# Patient Record
Sex: Female | Born: 2000 | Race: Black or African American | Hispanic: No | Marital: Single | Smoking: Never smoker
Health system: Southern US, Community
[De-identification: ages and names within clinical notes are randomized; demographics above are authoritative.]

## PROBLEM LIST (undated history)

## (undated) ENCOUNTER — Inpatient Hospital Stay (HOSPITAL_COMMUNITY): Payer: Self-pay

## (undated) DIAGNOSIS — R1110 Vomiting, unspecified: Secondary | ICD-10-CM

## (undated) DIAGNOSIS — G4700 Insomnia, unspecified: Secondary | ICD-10-CM

## (undated) DIAGNOSIS — O1414 Severe pre-eclampsia complicating childbirth: Secondary | ICD-10-CM

## (undated) DIAGNOSIS — F308 Other manic episodes: Secondary | ICD-10-CM

## (undated) DIAGNOSIS — O98819 Other maternal infectious and parasitic diseases complicating pregnancy, unspecified trimester: Secondary | ICD-10-CM

## (undated) DIAGNOSIS — K219 Gastro-esophageal reflux disease without esophagitis: Secondary | ICD-10-CM

## (undated) DIAGNOSIS — B2790 Infectious mononucleosis, unspecified without complication: Secondary | ICD-10-CM

## (undated) DIAGNOSIS — F329 Major depressive disorder, single episode, unspecified: Secondary | ICD-10-CM

## (undated) DIAGNOSIS — K589 Irritable bowel syndrome without diarrhea: Secondary | ICD-10-CM

## (undated) DIAGNOSIS — O139 Gestational [pregnancy-induced] hypertension without significant proteinuria, unspecified trimester: Secondary | ICD-10-CM

## (undated) DIAGNOSIS — A749 Chlamydial infection, unspecified: Secondary | ICD-10-CM

## (undated) DIAGNOSIS — N390 Urinary tract infection, site not specified: Secondary | ICD-10-CM

## (undated) DIAGNOSIS — F509 Eating disorder, unspecified: Secondary | ICD-10-CM

## (undated) DIAGNOSIS — F319 Bipolar disorder, unspecified: Secondary | ICD-10-CM

## (undated) DIAGNOSIS — R4586 Emotional lability: Secondary | ICD-10-CM

## (undated) DIAGNOSIS — G2111 Neuroleptic induced parkinsonism: Secondary | ICD-10-CM

## (undated) DIAGNOSIS — O039 Complete or unspecified spontaneous abortion without complication: Secondary | ICD-10-CM

## (undated) DIAGNOSIS — F419 Anxiety disorder, unspecified: Secondary | ICD-10-CM

## (undated) DIAGNOSIS — G43909 Migraine, unspecified, not intractable, without status migrainosus: Secondary | ICD-10-CM

## (undated) DIAGNOSIS — F32A Depression, unspecified: Secondary | ICD-10-CM

## (undated) DIAGNOSIS — N200 Calculus of kidney: Secondary | ICD-10-CM

## (undated) HISTORY — DX: Severe pre-eclampsia complicating childbirth: O14.14

## (undated) HISTORY — PX: COLONOSCOPY: SHX174

## (undated) HISTORY — DX: Vomiting, unspecified: R11.10

## (undated) HISTORY — DX: Chlamydial infection, unspecified: A74.9

## (undated) HISTORY — DX: Gastro-esophageal reflux disease without esophagitis: K21.9

## (undated) HISTORY — DX: Other maternal infectious and parasitic diseases complicating pregnancy, unspecified trimester: O98.819

## (undated) HISTORY — PX: ESOPHAGOGASTRODUODENOSCOPY ENDOSCOPY: SHX5814

## (undated) HISTORY — DX: Neuroleptic induced Parkinsonism: G21.11

---
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
## 2019-01-31 ENCOUNTER — Emergency Department (HOSPITAL_COMMUNITY)
Admission: EM | Admit: 2019-01-31 | Discharge: 2019-01-31 | Disposition: A | Discharge status: Home / Self Care | Payer: Medicaid Other | Attending: Emergency Medicine | Admitting: Emergency Medicine

## 2019-01-31 ENCOUNTER — Encounter (HOSPITAL_COMMUNITY): Payer: Self-pay | Admitting: Emergency Medicine

## 2019-01-31 ENCOUNTER — Other Ambulatory Visit: Payer: Self-pay

## 2019-01-31 DIAGNOSIS — R3915 Urgency of urination: Secondary | ICD-10-CM | POA: Insufficient documentation

## 2019-01-31 DIAGNOSIS — Z7722 Contact with and (suspected) exposure to environmental tobacco smoke (acute) (chronic): Secondary | ICD-10-CM | POA: Diagnosis not present

## 2019-01-31 DIAGNOSIS — N3000 Acute cystitis without hematuria: Secondary | ICD-10-CM | POA: Diagnosis not present

## 2019-01-31 DIAGNOSIS — R1030 Lower abdominal pain, unspecified: Secondary | ICD-10-CM

## 2019-01-31 DIAGNOSIS — R110 Nausea: Secondary | ICD-10-CM | POA: Insufficient documentation

## 2019-01-31 DIAGNOSIS — R350 Frequency of micturition: Secondary | ICD-10-CM | POA: Insufficient documentation

## 2019-01-31 LAB — COMPREHENSIVE METABOLIC PANEL
ALT: 13 U/L (ref 0–44)
AST: 15 U/L (ref 15–41)
Albumin: 3.9 g/dL (ref 3.5–5.0)
Alkaline Phosphatase: 85 U/L (ref 38–126)
Anion gap: 10 (ref 5–15)
BUN: 11 mg/dL (ref 6–20)
CO2: 24 mmol/L (ref 22–32)
Calcium: 9.4 mg/dL (ref 8.9–10.3)
Chloride: 103 mmol/L (ref 98–111)
Creatinine, Ser: 0.68 mg/dL (ref 0.44–1.00)
GFR calc Af Amer: >60 mL/min (ref >60)
GFR calc non Af Amer: >60 mL/min (ref >60)
Glucose, Bld: 97 mg/dL (ref 70–99)
Potassium: 4.4 mmol/L (ref 3.5–5.1)
Sodium: 137 mmol/L (ref 135–145)
Total Bilirubin: 0.5 mg/dL (ref 0.3–1.2)
Total Protein: 7.1 g/dL (ref 6.5–8.1)

## 2019-01-31 LAB — CBC
HCT: 41.9 % (ref 36.0–46.0)
Hemoglobin: 14.3 g/dL (ref 12.0–15.0)
MCH: 30.2 pg (ref 26.0–34.0)
MCHC: 34.1 g/dL (ref 30.0–36.0)
MCV: 88.6 fL (ref 80.0–100.0)
Platelets: 377 10*3/uL (ref 150–400)
RBC: 4.73 MIL/uL (ref 3.87–5.11)
RDW: 12.9 % (ref 11.5–15.5)
WBC: 8.6 10*3/uL (ref 4.0–10.5)
nRBC: 0 % (ref 0.0–0.2)

## 2019-01-31 LAB — URINALYSIS, ROUTINE W REFLEX MICROSCOPIC
Bilirubin Urine: NEGATIVE
Glucose, UA: NEGATIVE mg/dL
Ketones, ur: NEGATIVE mg/dL
Leukocytes,Ua: NEGATIVE
Nitrite: POSITIVE — AB
Protein, ur: NEGATIVE mg/dL
Specific Gravity, Urine: 1.019 (ref 1.005–1.030)
pH: 6 (ref 5.0–8.0)

## 2019-01-31 LAB — I-STAT BETA HCG BLOOD, ED (MC, WL, AP ONLY): I-stat hCG, quantitative: <5 m[IU]/mL (ref <5)

## 2019-01-31 LAB — LIPASE, BLOOD: Lipase: 23 U/L (ref 11–51)

## 2019-01-31 MED ORDER — SODIUM CHLORIDE 0.9% FLUSH: 3.0000 mL | Freq: Once | INTRAVENOUS | Status: DC

## 2019-01-31 MED ORDER — ACETAMINOPHEN 500 MG PO TABS
1000.0000 mg | ORAL_TABLET | Freq: Once | ORAL | Status: AC
  Administered 2019-01-31: 1000 mg via ORAL
  Filled 2019-01-31: qty 2

## 2019-01-31 MED ORDER — CEPHALEXIN 250 MG PO CAPS
500.0000 mg | ORAL_CAPSULE | Freq: Once | ORAL | Status: AC
  Administered 2019-01-31: 08:00:00 500 mg via ORAL
  Filled 2019-01-31: qty 2

## 2019-01-31 MED ORDER — CEPHALEXIN 500 MG PO CAPS: 500.0000 mg | ORAL_CAPSULE | Freq: Four times a day (QID) | ORAL | 0 refills | Status: DC

## 2019-01-31 MED ORDER — PHENAZOPYRIDINE HCL 200 MG PO TABS: 200.0000 mg | ORAL_TABLET | Freq: Three times a day (TID) | ORAL | 0 refills | Status: DC

## 2019-01-31 MED ORDER — NAPROXEN 500 MG PO TABS: 500.0000 mg | ORAL_TABLET | Freq: Two times a day (BID) | ORAL | 0 refills | Status: DC

## 2019-01-31 NOTE — Discharge Instructions (Signed)
Your abdominal pain is from a urinary tract infection.  Take cephalexin (Keflex) as prescribed 4 times daily.  For bladder pain take Pyridium, this medicine may make your urine really bright orange, this is normal. For pain and inflammation you can use a combination of acetaminophen and naproxen.  Take [phone] mg acetaminophen (tylenol) every 6 hours and/or 500 mg naproxen every 12 hours You can take these separately or combine them for maximum pain control. Do not exceed 4,000 mg acetaminophen or 1,000 mg naproxen in a 24 hour period.  Do not take naproxen/ibuprofen containing products if you are pregnant or have history of kidney disease, ulcers, GI bleeding, severe acid reflux, or take a blood thinner.  Do not take acetaminophen if you have liver disease.

## 2019-01-31 NOTE — ED Triage Notes (Signed)
Patient here with abdominal pain for the last three days.  Patient has had some nausea, but not at this time.  Patient states that it is generalized abdominal pain.  She states she could be pregnant.

## 2019-01-31 NOTE — ED Provider Notes (Signed)
[facility]ENC[facility] Provider Note   CSN: [id number] Arrival date & time: 01/31/19  [id number]    History   Chief Complaint Chief Complaint  Patient presents with  . Abdominal Pain    HPI Jamie Lawrence is a 18 y.o. female presents to the ER for evaluation of lower abdominal pain.  Onset 3 days ago.  Persistent, worsening, constant described as sharp and stabbing.  Localized to the lower abdomen.  Has had associated intermittent, milder bilateral side pain that switches between the right and the left, nausea, urinary frequency and urinary urgency.  A few days ago had vaginal blood spotting but not a full period.  States she has not had a period in about 2 months and could be pregnant.  No associated fever, vomiting, diarrhea, constipation, blood in stool, dysuria, abnormal vaginal discharge.  She is not concerned about STDs today.  No interventions.  Pain is worse with movement and palpation.  No alleviating factors.      HPI  Past Medical History:  Diagnosis Date  . Anxiety   . Bipolar depression (HCC)   . Chlamydia infection affecting pregnancy 07/06/2017   tx on 07/04/17  . Depression   . Eating disorder   . GERD (gastroesophageal reflux disease)   . Hypertension in pregnancy, preeclampsia, severe, delivered 07/05/2017  . Hypomania (HCC)   . IBS (irritable bowel syndrome)   . Insomnia   . Migraines   . Miscarriage March 2016  . Mononucleosis   . Mood disturbance   . 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 Vomiting     Patient Active Problem List   Diagnosis Date Noted  . Chlamydia infection 08/23/2017  . Depression   . Panic attacks 07/07/2017  . 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 GAD (generalized anxiety disorder) 12/08/2014  . Bulimia nervosa 12/08/2014    Past Surgical History:  Procedure Laterality Date  . COLONOSCOPY    . ESOPHAGOGASTRODUODENOSCOPY ENDOSCOPY        OB History    Gravida  2   Para  1   Term  0   Preterm  1   AB  1   Living  1     SAB  1   TAB  0   Ectopic  0   Multiple  0   Live Births  1            Home Medications    Prior to Admission medications   Medication Sig Start Date End Date Taking? Authorizing Provider  cephALEXin (KEFLEX) 500 MG capsule Take 1 capsule (500 mg total) by mouth 4 (four) times daily. 01/31/19   Liberty HandyGibbons, Edwardo Wojnarowski J, PA-C  lidocaine (LIDODERM) 5 % Place 1 patch onto the skin every 12 (twelve) hours. Remove & Discard patch within 12 hours or as directed by MD 11/09/18 11/09/19  Elson AreasSofia, Leslie K, PA-C  methocarbamol (ROBAXIN) 500 MG tablet Take 2 tablets (1,000 mg total) by mouth every 8 (eight) hours as needed for muscle spasms. 12/08/18   Loren RacerYelverton, David, MD  naproxen (NAPROSYN) 500 MG tablet Take 1 tablet (500 mg total) by mouth 2 (two) times daily. 01/31/19   Liberty HandyGibbons, Tannya Gonet J, PA-C  phenazopyridine (PYRIDIUM) 200 MG tablet Take 1 tablet (200 mg total) by mouth 3 (three) times daily. 01/31/19   Liberty HandyGibbons, Hasna Stefanik J, PA-C    Family History Family History  Problem Relation Age of Onset  . Hypertension Mother   .  Hypertension Father   . Mental illness Father   . Hypertension Maternal Grandmother   . Hypertension Paternal Grandmother   . Hypertension Paternal Grandfather   . Diabetes Paternal Grandfather   . Brain cancer Maternal Aunt   . Colon cancer Maternal Aunt     Social History Social History   Tobacco Use  . Smoking status: Passive Smoke Exposure - Never Smoker  . Smokeless tobacco: Never Used  Substance Use Topics  . Alcohol use: No    Alcohol/week: 0.0 standard drinks  . Drug use: No     Allergies   Patient has no known allergies.   Review of Systems Review of Systems  Gastrointestinal: Positive for abdominal pain.  Genitourinary: Positive for difficulty urinating, frequency and urgency.  All other systems reviewed and are negative.    Physical Exam  Updated Vital Signs BP (!) 141/84 (BP Location: Right Arm)   Pulse (!) 101   Temp 98.7 F (37.1 C) (Oral)   Resp 18   SpO2 100%   Physical Exam Vitals signs and nursing note reviewed.  Constitutional:      Appearance: She is well-developed.     Comments: Non toxic in NAD  HENT:     Head: Normocephalic and atraumatic.     Nose: Nose normal.  Eyes:     Conjunctiva/sclera: Conjunctivae normal.  Neck:     Musculoskeletal: Normal range of motion.  Cardiovascular:     Rate and Rhythm: Normal rate and regular rhythm.  Pulmonary:     Effort: Pulmonary effort is normal.     Breath sounds: Normal breath sounds.  Abdominal:     General: Bowel sounds are normal.     Palpations: Abdomen is soft.     Tenderness: There is abdominal tenderness in the suprapubic area.     Comments: Mild diffuse lower abdominal tenderness worse at suprapubic area.  No CVA tenderness.  No guarding, rigidity, rebound.  Negative McBurney's and Murphy's.  Active BS to lower quadrants.   Musculoskeletal: Normal range of motion.  Skin:    General: Skin is warm and dry.     Capillary Refill: Capillary refill takes less than 2 seconds.  Neurological:     Mental Status: She is alert.  Psychiatric:        Behavior: Behavior normal.      ED Treatments / Results  Labs (all labs ordered are listed, but only abnormal results are displayed) Labs Reviewed  URINALYSIS, ROUTINE W REFLEX MICROSCOPIC - Abnormal; Notable for the following components:      Result Value   APPearance HAZY (*)    Hgb urine dipstick SMALL (*)    Nitrite POSITIVE (*)    Bacteria, UA RARE (*)    All other components within normal limits  URINE CULTURE  LIPASE, BLOOD  COMPREHENSIVE METABOLIC PANEL  CBC  I-STAT BETA HCG BLOOD, ED ([facility], [facility], [facility] ONLY)    EKG None  Radiology No results found.  Procedures Procedures (including critical care time)  Medications Ordered in ED Medications  sodium chloride flush (NS) 0.9 % injection 3  mL (has no administration in time range)  cephALEXin (KEFLEX) capsule 500 mg (has no administration in time range)  acetaminophen (TYLENOL) tablet 1,000 mg (has no administration in time range)     Initial Impression / Assessment and Plan / ED Course  I have reviewed the triage vital signs and the nursing notes.  Pertinent labs & imaging results that were available during my care of the  patient were reviewed by me and considered in my medical decision making (see chart for details).  Clinical Course as of Jan 30 750  Fri Jan 31, 2019  6168 Hgb urine dipstick(!): SMALL [CG]  0736 Nitrite(!): POSITIVE [CG]  0736 WBC, UA: 6-10 [CG]  0736 Bacteria, UA(!): RARE [CG]    Clinical Course User Index [CG] Kinnie Feil, PA-C   I reviewed patient's EMR to obtain pertinent past medical history.  DDX includes UTI.  She has urinary symptoms, suprapubic abdominal pain.  No fever, flank pain, changes in BMs, abnormal vaginal bleeding or discharge.  She is not concerned for STD today.  ER work-up reviewed and interpreted by me.  Positive nitrite, 6-10 WBC and urine is a good sample.  No leukocytosis.  Vital signs are WNL.  Negative pregnancy test.  No signs of pyelonephritis on exam.  Will discharge with Keflex, Pyridium and anti-inflammatories.  Discussed plan with patient who is comfortable with this.  Return precautions discussed.  Final Clinical Impressions(s) / ED Diagnoses   Final diagnoses:  Acute cystitis without hematuria  Lower abdominal pain    ED Discharge Orders         Ordered    cephALEXin (KEFLEX) 500 MG capsule  4 times daily     01/31/19 0743    phenazopyridine (PYRIDIUM) 200 MG tablet  3 times daily     01/31/19 0743    naproxen (NAPROSYN) 500 MG tablet  2 times daily     01/31/19 0743           Kinnie Feil, PA-C 01/31/19 3729    Isla Pence, MD 01/31/19 [phone]

## 2019-02-05 ENCOUNTER — Other Ambulatory Visit: Payer: Self-pay

## 2019-02-05 ENCOUNTER — Encounter (HOSPITAL_COMMUNITY): Payer: Self-pay

## 2019-02-05 ENCOUNTER — Emergency Department (HOSPITAL_COMMUNITY): Payer: Medicaid Other

## 2019-02-05 ENCOUNTER — Emergency Department (HOSPITAL_COMMUNITY)
Admission: EM | Admit: 2019-02-05 | Discharge: 2019-02-05 | Disposition: A | Discharge status: Home / Self Care | Payer: Medicaid Other | Attending: Emergency Medicine | Admitting: Emergency Medicine

## 2019-02-05 DIAGNOSIS — Y998 Other external cause status: Secondary | ICD-10-CM | POA: Insufficient documentation

## 2019-02-05 DIAGNOSIS — S99912A Unspecified injury of left ankle, initial encounter: Secondary | ICD-10-CM

## 2019-02-05 DIAGNOSIS — Y9301 Activity, walking, marching and hiking: Secondary | ICD-10-CM | POA: Insufficient documentation

## 2019-02-05 DIAGNOSIS — X509XXA Other and unspecified overexertion or strenuous movements or postures, initial encounter: Secondary | ICD-10-CM | POA: Diagnosis not present

## 2019-02-05 DIAGNOSIS — Z7722 Contact with and (suspected) exposure to environmental tobacco smoke (acute) (chronic): Secondary | ICD-10-CM | POA: Diagnosis not present

## 2019-02-05 DIAGNOSIS — Y92838 Other recreation area as the place of occurrence of the external cause: Secondary | ICD-10-CM | POA: Diagnosis not present

## 2019-02-05 NOTE — ED Triage Notes (Signed)
Pt states she fell down a hill today around 1917 and hurt her left ankle. Pt states her foot feels numb and is unable to walk on it.

## 2019-02-05 NOTE — ED Provider Notes (Signed)
[place] [facility] DEPT Provider Note   CSN: [id number] Arrival date & time: 02/05/19  2021    History   Chief Complaint Chief Complaint  Patient presents with  . Ankle Injury    HPI Jamie Lawrence is a 18 y.o. female.     HPI   Jamie Lawrence is a 18 y.o. female, with a history of migraine, bipolar, anxiety, presenting to the ED with left ankle injury that occurred shortly prior to arrival.  States she was walking down a hill, tripped, and twisted her left ankle. Her pain to the left ankle is lateral, moderate, stinging, nonradiating. She also endorses decreased sensation to the left foot. Denies knee or hip pain, neck/back pain, head injury, LOC, any other complaints.     Past Medical History:  Diagnosis Date  . Anxiety   . Bipolar depression ([place])   . Chlamydia infection affecting pregnancy 07/06/2017   tx on 07/04/17  . Depression   . Eating disorder   . GERD (gastroesophageal reflux disease)   . Hypertension in pregnancy, preeclampsia, severe, delivered 07/05/2017  . Hypomania ([place])   . IBS (irritable bowel syndrome)   . Insomnia   . Migraines   . Miscarriage March 2016  . Mononucleosis   . Mood disturbance   . Neuroleptic-induced Parkinsonism ([place]) 12/08/2014  . Vomiting     Patient Active Problem List   Diagnosis Date Noted  . Chlamydia infection 08/23/2017  . Depression   . Panic attacks 07/07/2017  . MDD (major depressive disorder), single episode, severe , no psychosis ([place]) 12/08/2014  . GAD (generalized anxiety disorder) 12/08/2014  . Bulimia nervosa 12/08/2014    Past Surgical History:  Procedure Laterality Date  . COLONOSCOPY    . ESOPHAGOGASTRODUODENOSCOPY ENDOSCOPY       OB History    Gravida  2   Para  1   Term  0   Preterm  1   AB  1   Living  1     SAB  1   TAB  0   Ectopic  0   Multiple  0   Live Births  1            Home Medications    Prior to Admission medications    Medication Sig Start Date End Date Taking? Authorizing Provider  cephALEXin (KEFLEX) 500 MG capsule Take 1 capsule (500 mg total) by mouth 4 (four) times daily. Patient not taking: Reported on 02/05/2019 01/31/19   Kinnie Feil, PA-C  lidocaine (LIDODERM) 5 % Place 1 patch onto the skin every 12 (twelve) hours. Remove & Discard patch within 12 hours or as directed by MD Patient not taking: Reported on 02/05/2019 11/09/18 11/09/19  Fransico Meadow, PA-C  methocarbamol (ROBAXIN) 500 MG tablet Take 2 tablets (1,000 mg total) by mouth every 8 (eight) hours as needed for muscle spasms. Patient not taking: Reported on 02/05/2019 12/08/18   Julianne Rice, MD  naproxen (NAPROSYN) 500 MG tablet Take 1 tablet (500 mg total) by mouth 2 (two) times daily. Patient not taking: Reported on 02/05/2019 01/31/19   Kinnie Feil, PA-C  phenazopyridine (PYRIDIUM) 200 MG tablet Take 1 tablet (200 mg total) by mouth 3 (three) times daily. Patient not taking: Reported on 02/05/2019 01/31/19   Kinnie Feil, PA-C    Family History Family History  Problem Relation Age of Onset  . Hypertension Mother   . Hypertension Father   . Mental illness Father   .  Hypertension Maternal Grandmother   . Hypertension Paternal Grandmother   . Hypertension Paternal Grandfather   . Diabetes Paternal Grandfather   . Brain cancer Maternal Aunt   . Colon cancer Maternal Aunt     Social History Social History   Tobacco Use  . Smoking status: Passive Smoke Exposure - Never Smoker  . Smokeless tobacco: Never Used  Substance Use Topics  . Alcohol use: No    Alcohol/week: 0.0 standard drinks  . Drug use: No     Allergies   Patient has no known allergies.   Review of Systems Review of Systems  Musculoskeletal: Positive for arthralgias. Negative for back pain and neck pain.  Neurological: Negative for weakness.     Physical Exam Updated Vital Signs BP (!) 132/98 (BP Location: Left Arm)   Pulse 92   Temp  98.6 F (37 C) (Oral)   Resp 15   Ht 4\' 11"  (1.499 m)   Wt 59 kg   SpO2 100%   BMI 26.26 kg/m   Physical Exam Vitals signs and nursing note reviewed.  Constitutional:      General: She is not in acute distress.    Appearance: She is well-developed. She is not diaphoretic.  HENT:     Head: Normocephalic and atraumatic.  Eyes:     Conjunctiva/sclera: Conjunctivae normal.  Neck:     Musculoskeletal: Neck supple.  Cardiovascular:     Rate and Rhythm: Normal rate and regular rhythm.     Pulses:          Dorsalis pedis pulses are 2+ on the left side.       Posterior tibial pulses are 2+ on the left side.     Comments: Patient's foot and ankle are appropriately warm to the touch and comparable to contralateral side. Pulmonary:     Effort: Pulmonary effort is normal.  Musculoskeletal:     Comments: Some tenderness and swelling to the left lateral malleolus without noted deformity, instability, or color change. No tenderness, pain, swelling, or other abnormality to the left knee.  Full range of motion in the left knee.  Normal motor function intact in all other extremities. No midline spinal tenderness.   Skin:    General: Skin is warm and dry.     Capillary Refill: Capillary refill takes less than 2 seconds.     Coloration: Skin is not pale.  Neurological:     Mental Status: She is alert.     Comments: Patient endorses lack of sensation to light touch and pain to the left foot through all distributions and a low lying sock distribution in a line starting just inferior to the malleoli. She has some motor function in the toes of the left foot. Strength 5/5 with plantar and dorsiflexion of the left foot. Strength 5/5 with flexion and extension of the left knee.  Psychiatric:        Behavior: Behavior normal.      ED Treatments / Results  Labs (all labs ordered are listed, but only abnormal results are displayed) Labs Reviewed - No data to display  EKG None  Radiology Dg  Ankle Complete Left  Result Date: 02/05/2019 CLINICAL DATA:  Status post fall today with left ankle pain. EXAM: LEFT ANKLE COMPLETE - 3+ VIEW COMPARISON:  None. FINDINGS: There is no evidence of fracture, dislocation, or joint effusion. There is no evidence of arthropathy or other focal bone abnormality. Soft tissues are unremarkable. IMPRESSION: Negative. Electronically Signed   By: Scherry RanWei-Chen  Juel BurrowLin M.D.   On: 02/05/2019 20:52    Procedures Procedures (including critical care time)  Medications Ordered in ED Medications - No data to display   Initial Impression / Assessment and Plan / ED Course  I have reviewed the triage vital signs and the nursing notes.  Pertinent labs & imaging results that were available during my care of the patient were reviewed by me and considered in my medical decision making (see chart for details).  Clinical Course as of Feb 05 41  Wed Feb 05, 2019  2213 Spoke with Dr. Roda ShuttersXu, orthopedics.  States this type of sensation abnormality will likely resolve on its own over time.  Recommends putting patient in cam boot and given instructions for regular treatment for sprain. Follow up in office in 1-2 weeks.   [SJ]    Clinical Course User Index [SJ] Lauralie Blacksher C, PA-C       Patient presents for evaluation following a left ankle injury.  No acute abnormality on x-ray.  Placed in cam boot, given crutches, and will have orthopedic follow-up. The patient was given instructions for home care as well as return precautions. Patient voices understanding of these instructions, accepts the plan, and is comfortable with discharge.  Final Clinical Impressions(s) / ED Diagnoses   Final diagnoses:  Injury of left ankle, initial encounter    ED Discharge Orders    None       Concepcion LivingJoy, Kelten Enochs C, PA-C 02/06/19 [id number]    Tegeler, Canary Brimhristopher J, MD 02/06/19 [phone]

## 2019-02-05 NOTE — Discharge Instructions (Addendum)
You have been seen today for an ankle injury. There were no acute abnormalities on the x-rays, including no sign of fracture or dislocation, however, there could be injuries to the soft tissues, such as the ligaments or tendons that are not seen on xrays. There could also be what are called occult fractures that are small fractures not seen on xray. Antiinflammatory medications: Take 600 mg of ibuprofen every 6 hours or 440 mg (over the counter dose) to 500 mg (prescription dose) of naproxen every 12 hours for the next 3 days. After this time, these medications may be used as needed for pain. Take these medications with food to avoid upset stomach. Choose only one of these medications, do not take them together. Acetaminophen (generic for Tylenol): Should you continue to have additional pain while taking the ibuprofen or naproxen, you may add in acetaminophen as needed. Your daily total maximum amount of acetaminophen from all sources should be limited to 4000mg /day for persons without liver problems, or 2000mg /day for those with liver problems. Ice: May apply ice to the area over the next 24 hours for 15 minutes at a time to reduce swelling. Elevation: Keep the extremity elevated as often as possible to reduce pain and inflammation. Support: Wear the cam boot for support and comfort. Wear this until pain resolves. You will be weight-bearing as tolerated, which means you can slowly start to put weight on the extremity and increase amount and frequency as pain allows. Exercises: Start by performing these exercises a few times a week, increasing the frequency until you are performing them twice daily.  Follow up: If symptoms are improving, you may follow up with your primary care provider for any continued management. If symptoms are not starting to improve within a week, you should follow up with the orthopedic specialist within two weeks. Return: Return to the ED for new/spreading numbness, weakness,  increasing pain, overall worsening symptoms, loss of function, or if symptoms are not improving, you have tried to follow up with the orthopedic specialist, and have been unable to do so.  For prescription assistance, may try using prescription discount sites or apps, such as goodrx.com

## 2019-03-08 ENCOUNTER — Ambulatory Visit (HOSPITAL_COMMUNITY)
Admission: EM | Admit: 2019-03-08 | Discharge: 2019-03-08 | Disposition: A | Discharge status: Home / Self Care | Payer: Medicaid Other | Attending: Family Medicine | Admitting: Family Medicine

## 2019-03-08 ENCOUNTER — Encounter (HOSPITAL_COMMUNITY): Payer: Self-pay

## 2019-03-08 DIAGNOSIS — Z3202 Encounter for pregnancy test, result negative: Secondary | ICD-10-CM

## 2019-03-08 DIAGNOSIS — M25512 Pain in left shoulder: Secondary | ICD-10-CM | POA: Diagnosis present

## 2019-03-08 DIAGNOSIS — R1030 Lower abdominal pain, unspecified: Secondary | ICD-10-CM

## 2019-03-08 DIAGNOSIS — N3090 Cystitis, unspecified without hematuria: Secondary | ICD-10-CM

## 2019-03-08 LAB — POCT URINALYSIS DIP (DEVICE)
Bilirubin Urine: NEGATIVE
Glucose, UA: NEGATIVE mg/dL
Ketones, ur: NEGATIVE mg/dL
Leukocytes,Ua: NEGATIVE
Nitrite: POSITIVE — AB
Protein, ur: NEGATIVE mg/dL
Specific Gravity, Urine: >=1.03 (ref 1.005–1.030)
Urobilinogen, UA: 0.2 mg/dL (ref 0.0–1.0)
pH: 6.5 (ref 5.0–8.0)

## 2019-03-08 LAB — POCT PREGNANCY, URINE: Preg Test, Ur: NEGATIVE

## 2019-03-08 MED ORDER — PREDNISONE 10 MG (21) PO TBPK: ORAL_TABLET | Freq: Every day | ORAL | 0 refills | Status: DC

## 2019-03-08 MED ORDER — CEPHALEXIN 500 MG PO CAPS: 500.0000 mg | ORAL_CAPSULE | Freq: Two times a day (BID) | ORAL | 0 refills | Status: DC

## 2019-03-08 NOTE — ED Notes (Signed)
No specimen has been provided/obtained at this time

## 2019-03-08 NOTE — ED Triage Notes (Signed)
Pt present abdominal and shoulder pain. Pt states her abdominal pain is located at the lower level, she having cramps. 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

## 2019-03-08 NOTE — Discharge Instructions (Addendum)

## 2019-03-08 NOTE — ED Provider Notes (Signed)
[name]   [id number] 03/08/19 Arrival Time: [place] PLAN:  1. Lower abdominal pain   2. Acute pain of left shoulder   3. Cystitis     Benign abdominal exam. No indications for urgent abdominal/pelvic imaging at this time. Will treat for UTI. Monitor symptoms closely. Discussed.  Meds ordered this encounter  Medications  . cephALEXin (KEFLEX) 500 MG capsule    Sig: Take 1 capsule (500 mg total) by mouth 2 (two) times daily.    Dispense:  10 capsule    Refill:  0  . predniSONE (STERAPRED UNI-PAK 21 TAB) 10 MG (21) TBPK tablet    Sig: Take by mouth daily. Take as directed.    Dispense:  21 tablet    Refill:  0   Urine culture sent.   Discharge Instructions     You have been seen today for abdominal pain. Your evaluation was not suggestive of any emergent condition requiring medical intervention at this time. However, some abdominal problems make take more time to appear. Therefore, it is very important for you to pay attention to any new symptoms or worsening of your current condition.  Please return here or to the Emergency Department immediately should you begin to feel worse in any way or have any of the following symptoms: increasing or different abdominal pain, persistent vomiting, inability to drink fluids, fevers, or shaking chills.    Follow-up Information    Medicine, [facility].   Specialty: Family Medicine Why: As needed. Contact information: [place] [address] [phone]          Trial of prednisone for persistent shoulder pain. May need orthopaedic f/u if this does not improve.   Reviewed expectations re: course of current medical issues. Questions answered. Outlined signs and symptoms indicating need for more acute intervention. Patient verbalized understanding. After Visit Summary given.   SUBJECTIVE: History from: patient. Jamie Lawrence is a 18 y.o. female who presents with  complaint of fairly persistent suprapubic abdominal discomfort. Onset gradual, 1-2 d ago. Discomfort described as dull; without radiation; does not wake her at night. Symptoms are unchanged since beginning. Fever: absent. Aggravating factors: have not been identified. Alleviating factors: have not been identified. Associated symptoms: mild fatigue. She denies belching, chills, constipation, diarrhea, fever, nausea, sweats and vomiting. Appetite: normal. PO intake: normal. Ambulatory without assistance. Urinary symptoms: questions mild urinary frequency. Bowel movements: have not significantly changed; last bowel movement within the past couple of days; without blood. History of similar: no. OTC treatment: none reported.  No LMP recorded. Patient has had an implant.   Past Surgical History:  Procedure Laterality Date  . COLONOSCOPY    . ESOPHAGOGASTRODUODENOSCOPY ENDOSCOPY     Also reports L shoulder pain. Noticed over the past 1-2 weeks. Lifts heavy bags of laundry at work. Seen at other facility; "said it may be my rotator cuff". Anti-inflammatories without much relief. Pain worse when lifting L arm. No extremity sensation changes or weakness. "Think I first felt a pop when the pain first started."  ROS: As per HPI. All other systems negative.  OBJECTIVE:  Vitals:   03/08/19 1604  BP: 133/77  Pulse: 85  Resp: 16  Temp: 98.7 F (37.1 C)  TempSrc: Oral  SpO2: 99%    General appearance: alert, oriented, no acute distress Lungs: clear to auscultation bilaterally; unlabored respirations Heart: regular rate and rhythm Abdomen: soft; without distention; no tenderness; normal bowel sounds; without masses or  organomegaly; without guarding or rebound tenderness Back: without CVA tenderness; FROM at waist Extremities: without LE edema; symmetrical; without gross deformities; vague L shoulder discomfort reported with internal rotation of shoulder Skin: warm and dry Neurologic: normal gait  Psychological: alert and cooperative; normal mood and affect  No Known Allergies                                             Past Medical History:  Diagnosis Date  . Anxiety   . Bipolar depression (HCC)   . Chlamydia infection affecting pregnancy 07/06/2017   tx on 07/04/17  . Depression   . Eating disorder   . GERD (gastroesophageal reflux disease)   . Hypertension in pregnancy, preeclampsia, severe, delivered 07/05/2017  . Hypomania (HCC)   . IBS (irritable bowel syndrome)   . Insomnia   . Migraines   . Miscarriage March 2016  . Mononucleosis   . Mood disturbance   . 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 Vomiting    Social History   Socioeconomic History  . Marital status: Single    Spouse name: Not on file  . Number of children: Not on file  . Years of education: Not on file  . Highest education level: Not on file  Occupational History  . Occupation: Consulting civil engineertudent    Comment: [employer]  Social Needs  . Financial resource strain: Not on file  . Food insecurity    Worry: Not on file    Inability: Not on file  . Transportation needs    Medical: Not on file    Non-medical: Not on file  Tobacco Use  . Smoking status: Passive Smoke Exposure - Never Smoker  . Smokeless tobacco: Never Used  Substance and Sexual Activity  . Alcohol use: No    Alcohol/week: 0.0 standard drinks  . Drug use: No  . Sexual activity: Not Currently    Partners: Male    Birth control/protection: None    Comment: last sexual activity one month ago  Lifestyle  . Physical activity    Days per week: Not on file    Minutes per session: Not on file  . Stress: Not on file  Relationships  . Social Musicianconnections    Talks on phone: Not on file    Gets together: Not on file    Attends religious service: Not on file    Active member of club or organization: Not on file    Attends meetings of clubs or organizations: Not on file    Relationship status: Not on file  . Intimate  partner violence    Fear of current or ex partner: Not on file    Emotionally abused: Not on file    Physically abused: Not on file    Forced sexual activity: Not on file  Other Topics Concern  . Not on file  Social History Narrative  . Not on file   Family History  Problem Relation Age of Onset  . Hypertension Mother   . Hypertension Father   . Mental illness Father   . Hypertension Maternal Grandmother   . Hypertension Paternal Grandmother   . Hypertension Paternal Grandfather   . Diabetes Paternal Grandfather   . Brain cancer Maternal Aunt   . Colon cancer Maternal Renato GailsAunt      Kiala Faraj, MD 03/10/19 [phone]

## 2019-03-11 LAB — URINE CULTURE: Culture: >=100000 — AB

## 2019-03-12 ENCOUNTER — Telehealth (HOSPITAL_COMMUNITY): Payer: Self-pay | Admitting: Emergency Medicine

## 2019-03-12 NOTE — Telephone Encounter (Signed)
Urine culture was positive for e coli and was given keflex at urgent care visit. Pt contacted and made aware, educated on completing antibiotic and to follow up if symptoms are persistent. Verbalized understanding.    

## 2019-03-30 ENCOUNTER — Ambulatory Visit (HOSPITAL_COMMUNITY)
Admission: EM | Admit: 2019-03-30 | Discharge: 2019-03-30 | Disposition: A | Discharge status: Home / Self Care | Payer: Medicaid Other | Attending: Family Medicine | Admitting: Family Medicine

## 2019-03-30 ENCOUNTER — Encounter (HOSPITAL_COMMUNITY): Payer: Self-pay

## 2019-03-30 ENCOUNTER — Other Ambulatory Visit: Payer: Self-pay

## 2019-03-30 DIAGNOSIS — J029 Acute pharyngitis, unspecified: Secondary | ICD-10-CM | POA: Insufficient documentation

## 2019-03-30 DIAGNOSIS — R000 Tachycardia, unspecified: Secondary | ICD-10-CM | POA: Insufficient documentation

## 2019-03-30 DIAGNOSIS — R509 Fever, unspecified: Secondary | ICD-10-CM

## 2019-03-30 DIAGNOSIS — Z20828 Contact with and (suspected) exposure to other viral communicable diseases: Secondary | ICD-10-CM | POA: Diagnosis not present

## 2019-03-30 DIAGNOSIS — Z1159 Encounter for screening for other viral diseases: Secondary | ICD-10-CM | POA: Diagnosis not present

## 2019-03-30 LAB — POCT RAPID STREP A: Streptococcus, Group A Screen (Direct): NEGATIVE

## 2019-03-30 MED ORDER — IBUPROFEN 800 MG PO TABS
800.0000 mg | ORAL_TABLET | Freq: Once | ORAL | Status: AC
  Administered 2019-03-30: 19:00:00 800 mg via ORAL

## 2019-03-30 MED ORDER — IBUPROFEN 800 MG PO TABS
ORAL_TABLET | ORAL | Status: AC
  Filled 2019-03-30: qty 1

## 2019-03-30 MED ORDER — IBUPROFEN 800 MG PO TABS: 800.0000 mg | ORAL_TABLET | Freq: Three times a day (TID) | ORAL | 0 refills | Status: DC

## 2019-03-30 MED ORDER — ACETAMINOPHEN 325 MG PO TABS
650.0000 mg | ORAL_TABLET | Freq: Once | ORAL | Status: AC
  Administered 2019-03-30: 18:00:00 650 mg via ORAL

## 2019-03-30 MED ORDER — ACETAMINOPHEN 325 MG PO TABS
ORAL_TABLET | ORAL | Status: AC
  Filled 2019-03-30: qty 2

## 2019-03-30 NOTE — ED Provider Notes (Signed)
[place]    CSN: [id number] Arrival date & time: 03/30/19  1738      History   Chief Complaint Chief Complaint  Patient presents with  . Sore Throat    HPI Jamie Lawrence is a 18 y.o. female history of anxiety and depression, presenting today for evaluation of a sore throat.  Patient states that this morning she woke up with a sore throat.  She has had pain with swallowing.  She was unaware fevers until arriving here.  Denies congestion or cough.  Denies chest pain or shortness of breath.  States that she ate chicken with garlic sauce last night for the first time.  Denies close contacts with similar symptoms, denies known exposure to [place].  Has taken Tussin, TheraFlu as well as cough drops.  Denies recreational drug use.  Has previously had mono when she was younger.  HPI  Past Medical History:  Diagnosis Date  . Anxiety   . Bipolar depression ([place])   . Chlamydia infection affecting pregnancy 07/06/2017   tx on 07/04/17  . Depression   . Eating disorder   . GERD (gastroesophageal reflux disease)   . Hypertension in pregnancy, preeclampsia, severe, delivered 07/05/2017  . Hypomania ([place])   . IBS (irritable bowel syndrome)   . Insomnia   . Migraines   . Miscarriage March 2016  . Mononucleosis   . Mood disturbance   . Neuroleptic-induced Parkinsonism ([place]) 12/08/2014  . Vomiting     Patient Active Problem List   Diagnosis Date Noted  . Chlamydia infection 08/23/2017  . Depression   . Panic attacks 07/07/2017  . MDD (major depressive disorder), single episode, severe , no psychosis ([place]) 12/08/2014  . GAD (generalized anxiety disorder) 12/08/2014  . Bulimia nervosa 12/08/2014    Past Surgical History:  Procedure Laterality Date  . COLONOSCOPY    . ESOPHAGOGASTRODUODENOSCOPY ENDOSCOPY      OB History    Gravida  2   Para  1   Term  0   Preterm  1   AB  1   Living  1     SAB  1   TAB  0   Ectopic  0   Multiple  0   Live Births   1            Home Medications    Prior to Admission medications   Not on File    Family History Family History  Problem Relation Age of Onset  . Hypertension Mother   . Hypertension Father   . Mental illness Father   . Hypertension Maternal Grandmother   . Hypertension Paternal Grandmother   . Hypertension Paternal Grandfather   . Diabetes Paternal Grandfather   . Brain cancer Maternal Aunt   . Colon cancer Maternal Aunt     Social History Social History   Tobacco Use  . Smoking status: Passive Smoke Exposure - Never Smoker  . Smokeless tobacco: Never Used  Substance Use Topics  . Alcohol use: No    Alcohol/week: 0.0 standard drinks  . Drug use: No     Allergies   Patient has no known allergies.   Review of Systems Review of Systems  Constitutional: Negative for activity change, appetite change, chills, fatigue and fever.  HENT: Positive for sore throat. Negative for congestion, ear pain, rhinorrhea, sinus pressure and trouble swallowing.   Eyes: Negative for discharge and redness.  Respiratory: Negative for cough, chest tightness and shortness of breath.  Cardiovascular: Negative for chest pain.  Gastrointestinal: Negative for abdominal pain, diarrhea, nausea and vomiting.  Musculoskeletal: Negative for myalgias.  Skin: Negative for rash.  Neurological: Negative for dizziness, light-headedness and headaches.     Physical Exam Triage Vital Signs ED Triage Vitals  Enc Vitals Group     BP 03/30/19 1817 129/86     Pulse Rate 03/30/19 1817 (!) 145     Resp 03/30/19 1817 19     Temp 03/30/19 1817 (!) 103.4 F (39.7 C)     Temp Source 03/30/19 1817 Oral     SpO2 03/30/19 1817 99 %     Weight --      Height --      Head Circumference --      Peak Flow --      Pain Score 03/30/19 1816 10     Pain Loc --      Pain Edu? --      Excl. in GC? --    No data found.  Updated Vital Signs BP 129/86 (BP Location: Left Arm)   Pulse (!) 127   Temp (!)  100.5 F (38.1 C) (Oral)   Resp 19   SpO2 99%   Visual Acuity Right Eye Distance:   Left Eye Distance:   Bilateral Distance:    Right Eye Near:   Left Eye Near:    Bilateral Near:     Physical Exam Vitals signs and nursing note reviewed.  Constitutional:      General: She is not in acute distress.    Appearance: She is well-developed.  HENT:     Head: Normocephalic and atraumatic.     Ears:     Comments: Bilateral ears without tenderness to palpation of external auricle, tragus and mastoid, EAC's without erythema or swelling, TM's with good bony landmarks and cone of light. Non erythematous.     Mouth/Throat:     Comments: Oral mucosa pink and moist, mild tonsillar enlargement or exudate. Posterior pharynx patent and erythematous, no uvula deviation or swelling. Normal phonation.  No soft palate swelling.  No petechiae. Eyes:     Conjunctiva/sclera: Conjunctivae normal.  Neck:     Musculoskeletal: Neck supple.     Comments: Full active range of motion of neck Cardiovascular:     Rate and Rhythm: Regular rhythm. Tachycardia present.     Heart sounds: No murmur.  Pulmonary:     Effort: Pulmonary effort is normal. No respiratory distress.     Breath sounds: Normal breath sounds.     Comments: Breathing comfortably at rest, CTABL, no wheezing, rales or other adventitious sounds auscultated Abdominal:     Palpations: Abdomen is soft.     Tenderness: There is no abdominal tenderness.  Skin:    General: Skin is warm and dry.  Neurological:     Mental Status: She is alert.      UC Treatments / Results  Labs (all labs ordered are listed, but only abnormal results are displayed) Labs Reviewed  NOVEL CORONAVIRUS, NAA (HOSP ORDER, SEND-OUT TO REF LAB; TAT 18-24 HRS)  CULTURE, GROUP A STREP [facility])  POCT RAPID STREP A    EKG   Radiology No results found.  Procedures Procedures (including critical care time)  Medications Ordered in UC Medications  acetaminophen  (TYLENOL) tablet 650 mg (650 mg Oral Given 03/30/19 1827)  ibuprofen (ADVIL) tablet 800 mg (800 mg Oral Given 03/30/19 1840)  acetaminophen (TYLENOL) 325 MG tablet (has no administration in time range)  ibuprofen (ADVIL) 800 MG tablet (has no administration in time range)    Initial Impression / Assessment and Plan / UC Course  I have reviewed the triage vital signs and the nursing notes.  Pertinent labs & imaging results that were available during my care of the patient were reviewed by me and considered in my medical decision making (see chart for details).  Clinical Course as of Mar 29 1918  Sun Mar 30, 2019  1853 HR rechecked 136   [HW]    Clinical Course User Index [HW] Deserai Cansler, [place] C, PA-C    EKG sinus tach at 146. Strep test negative.  Strep culture and COVID pending.  No sign of peritonsillar abscess at this time. No nuchal rigidity.   Airway patent.  Previous mono. Giving Tylenol and ibuprofen and monitoring response of heart rate/fever.  Heart rate improving along with fever.  Improved to 116 prior to discharge.  Patient stable.  Day 1 of symptoms, unclear cause of significant tachycardia at this time, recommending fever control as well as pushing fluids in the meantime.  Tylenol and ibuprofen help with fever as well as throat discomfort.  Monitor for development of worsening swelling or changing of symptoms over the next 48 hours.  Low threshold for follow-up if symptoms progressing or worsening her heart rate increasing again.Discussed strict return precautions. Patient verbalized understanding and is agreeable with plan.  Final Clinical Impressions(s) / UC Diagnoses   Final diagnoses:  Sore throat  Tachycardia     Discharge Instructions     Sore Throat  Your rapid strep tested Negative today. We will send for a culture and call in about 2 days if results are positive. COVID swab pending For now we will treat your sore throat as a virus with symptomatic management.    Please continue Tylenol or Ibuprofen for fever and pain. May try salt water gargles, cepacol lozenges, throat spray, or OTC cold relief medicine for throat discomfort. If you also have congestion take a daily anti-histamine like Zyrtec, Claritin, and a oral decongestant to help with post nasal drip that may be irritating your throat.   Stay hydrated and drink plenty of fluids to keep your throat coated relieve irritation.   Please follow-up if symptoms progressing/worsening over the next 48 hours, heart rate increasing, developing chest pain, shortness of breath, dizziness or lightheadedness, weakness.    ED Prescriptions    None     Controlled Substance Prescriptions [place] Controlled Substance Registry consulted? Not Applicable   Lew Dawes, [place] 03/30/19 1935

## 2019-03-30 NOTE — Discharge Instructions (Addendum)
Sore Throat  Your rapid strep tested Negative today. We will send for a culture and call in about 2 days if results are positive. COVID swab pending For now we will treat your sore throat as a virus with symptomatic management.   Please continue Tylenol or Ibuprofen for fever and pain. May try salt water gargles, cepacol lozenges, throat spray, or OTC cold relief medicine for throat discomfort. If you also have congestion take a daily anti-histamine like Zyrtec, Claritin, and a oral decongestant to help with post nasal drip that may be irritating your throat.   Use anti-inflammatories for pain/swelling. You may take up to 800 mg Ibuprofen every 8 hours with food. You may supplement Ibuprofen with Tylenol [phone] mg every 8 hours.   Stay hydrated and drink plenty of fluids to keep your throat coated relieve irritation.   Please follow-up if symptoms progressing/worsening over the next 48 hours, heart rate increasing, developing chest pain, shortness of breath, dizziness or lightheadedness, weakness.

## 2019-03-30 NOTE — ED Triage Notes (Signed)
Patient presents to Urgent Care with complaints of sore throat since "getting chicken with garlic sauce on it last night". Patient reports no sick contacts.

## 2019-03-31 ENCOUNTER — Emergency Department (HOSPITAL_COMMUNITY)
Admission: EM | Admit: 2019-03-31 | Discharge: 2019-03-31 | Disposition: A | Discharge status: Home / Self Care | Payer: Medicaid Other | Attending: Emergency Medicine | Admitting: Emergency Medicine

## 2019-03-31 ENCOUNTER — Encounter (HOSPITAL_COMMUNITY): Payer: Self-pay | Admitting: Emergency Medicine

## 2019-03-31 ENCOUNTER — Telehealth (HOSPITAL_COMMUNITY): Payer: Self-pay | Admitting: Emergency Medicine

## 2019-03-31 ENCOUNTER — Other Ambulatory Visit: Payer: Self-pay

## 2019-03-31 DIAGNOSIS — Z7722 Contact with and (suspected) exposure to environmental tobacco smoke (acute) (chronic): Secondary | ICD-10-CM | POA: Insufficient documentation

## 2019-03-31 DIAGNOSIS — J020 Streptococcal pharyngitis: Secondary | ICD-10-CM | POA: Diagnosis not present

## 2019-03-31 DIAGNOSIS — J029 Acute pharyngitis, unspecified: Secondary | ICD-10-CM | POA: Diagnosis present

## 2019-03-31 LAB — MONONUCLEOSIS SCREEN: Mono Screen: NEGATIVE

## 2019-03-31 LAB — NOVEL CORONAVIRUS, NAA (HOSP ORDER, SEND-OUT TO REF LAB; TAT 18-24 HRS): SARS-CoV-2, NAA: NOT DETECTED

## 2019-03-31 LAB — GROUP A STREP BY PCR: Group A Strep by PCR: DETECTED — AB

## 2019-03-31 MED ORDER — IBUPROFEN 800 MG PO TABS: 800.0000 mg | ORAL_TABLET | Freq: Three times a day (TID) | ORAL | 0 refills | Status: DC | PRN

## 2019-03-31 MED ORDER — HYDROCODONE-ACETAMINOPHEN 7.5-325 MG/15ML PO SOLN
10.0000 mL | Freq: Once | ORAL | Status: AC
  Administered 2019-03-31: 04:00:00 10 mL via ORAL
  Filled 2019-03-31: qty 15

## 2019-03-31 MED ORDER — LIDOCAINE VISCOUS HCL 2 % MT SOLN: 15.0000 mL | OROMUCOSAL | 0 refills | Status: DC | PRN

## 2019-03-31 MED ORDER — IBUPROFEN 800 MG PO TABS
800.0000 mg | ORAL_TABLET | Freq: Once | ORAL | Status: AC
  Administered 2019-03-31: 04:00:00 800 mg via ORAL
  Filled 2019-03-31: qty 1

## 2019-03-31 MED ORDER — PENICILLIN G BENZATHINE 1200000 UNIT/2ML IM SUSP
1.2000 10*6.[IU] | Freq: Once | INTRAMUSCULAR | Status: AC
  Administered 2019-03-31: 1.2 10*6.[IU] via INTRAMUSCULAR
  Filled 2019-03-31: qty 2

## 2019-03-31 NOTE — ED Provider Notes (Signed)
[place] DEPT Provider Note   CSN: [id number] Arrival date & time: 03/31/19  0300     History   Chief Complaint Chief Complaint  Patient presents with  . Sore Throat    HPI Jamie Lawrence is a 18 y.o. female.     The history is provided by the patient and medical records.     18 year old female with history of anxiety, bipolar depression, eating disorder, GERD, IBS, presenting to the ED with sore throat.  She was seen yesterday urgent care for same with a negative strep test.  She was also tested for COVID which is pending.  She was prescribed Motrin to help with pain/fever, however reports "the pharmacy was closed so I was unable to get anything".  She reports ongoing fever and sore throat.  She denies any difficulty swallowing, but states it feels like "razor blades" when she swallows.  She has not really wanted to eat or drink because of this.  She has not had any vomiting.  She denies any sick contacts or known COVID exposures.  She has not had any Tylenol or Motrin for her fever since yesterday.  Past Medical History:  Diagnosis Date  . Anxiety   . Bipolar depression ([place])   . Chlamydia infection affecting pregnancy 07/06/2017   tx on 07/04/17  . Depression   . Eating disorder   . GERD (gastroesophageal reflux disease)   . Hypertension in pregnancy, preeclampsia, severe, delivered 07/05/2017  . Hypomania ([place])   . IBS (irritable bowel syndrome)   . Insomnia   . Migraines   . Miscarriage March 2016  . Mononucleosis   . Mood disturbance   . Neuroleptic-induced Parkinsonism ([place]) 12/08/2014  . Vomiting     Patient Active Problem List   Diagnosis Date Noted  . Chlamydia infection 08/23/2017  . Depression   . Panic attacks 07/07/2017  . MDD (major depressive disorder), single episode, severe , no psychosis ([place]) 12/08/2014  . GAD (generalized anxiety disorder) 12/08/2014  . Bulimia nervosa 12/08/2014    Past Surgical History:   Procedure Laterality Date  . COLONOSCOPY    . ESOPHAGOGASTRODUODENOSCOPY ENDOSCOPY       OB History    Gravida  2   Para  1   Term  0   Preterm  1   AB  1   Living  1     SAB  1   TAB  0   Ectopic  0   Multiple  0   Live Births  1            Home Medications    Prior to Admission medications   Medication Sig Start Date End Date Taking? Authorizing Provider  ibuprofen (ADVIL) 800 MG tablet Take 1 tablet (800 mg total) by mouth 3 (three) times daily. 03/30/19   Wieters, Elesa Hacker, PA-C    Family History Family History  Problem Relation Age of Onset  . Hypertension Mother   . Hypertension Father   . Mental illness Father   . Hypertension Maternal Grandmother   . Hypertension Paternal Grandmother   . Hypertension Paternal Grandfather   . Diabetes Paternal Grandfather   . Brain cancer Maternal Aunt   . Colon cancer Maternal Aunt     Social History Social History   Tobacco Use  . Smoking status: Passive Smoke Exposure - Never Smoker  . Smokeless tobacco: Never Used  Substance Use Topics  . Alcohol use: No    Alcohol/week:  0.0 standard drinks  . Drug use: No     Allergies   Patient has no known allergies.   Review of Systems Review of Systems  HENT: Positive for sore throat.   All other systems reviewed and are negative.    Physical Exam Updated Vital Signs BP 134/82 (BP Location: Left Arm)   Pulse (!) 134   Temp (!) 103.1 F (39.5 C) (Oral)   Resp 14   SpO2 100%   Physical Exam Vitals signs and nursing note reviewed.  Constitutional:      Appearance: She is well-developed.     Comments: Warm to touch  HENT:     Head: Normocephalic and atraumatic.     Mouth/Throat:     Comments: Tonsils 2+ bilaterally but symmetric; uvula midline without evidence of peritonsillar abscess; handling secretions appropriately; no difficulty swallowing or speaking; normal phonation without stridor Eyes:     Conjunctiva/sclera: Conjunctivae normal.      Pupils: Pupils are equal, round, and reactive to light.  Neck:     Musculoskeletal: Normal range of motion.  Cardiovascular:     Rate and Rhythm: Regular rhythm. Tachycardia present.     Heart sounds: Normal heart sounds.  Pulmonary:     Effort: Pulmonary effort is normal.     Breath sounds: Normal breath sounds.  Abdominal:     General: Bowel sounds are normal.     Palpations: Abdomen is soft.  Musculoskeletal: Normal range of motion.  Skin:    General: Skin is warm and dry.  Neurological:     Mental Status: She is alert and oriented to person, place, and time.      ED Treatments / Results  Labs (all labs ordered are listed, but only abnormal results are displayed) Labs Reviewed  GROUP A STREP BY PCR - Abnormal; Notable for the following components:      Result Value   Group A Strep by PCR DETECTED (*)    All other components within normal limits  MONONUCLEOSIS SCREEN    EKG None  Radiology No results found.  Procedures Procedures (including critical care time)  Medications Ordered in ED Medications  HYDROcodone-acetaminophen (HYCET) 7.5-325 mg/15 ml solution 10 mL (10 mLs Oral Given 03/31/19 0340)  ibuprofen (ADVIL) tablet 800 mg (800 mg Oral Given 03/31/19 0339)  penicillin g benzathine (BICILLIN LA) 1200000 UNIT/2ML injection 1.2 Million Units (1.2 Million Units Intramuscular Given 03/31/19 0443)     Initial Impression / Assessment and Plan / ED Course  I have reviewed the triage vital signs and the nursing notes.  Pertinent labs & imaging results that were available during my care of the patient were reviewed by me and considered in my medical decision making (see chart for details).  18 year old female here with sore throat.  She was seen yesterday urgent care for same.  She had a negative strep test and was tested for COVID.  She was discharged home with prescription for Motrin, however states the pharmacy was closed and could not get this filled.  She  returns due to ongoing pain and fever.  She is not taking any medication since she was seen at urgent care.  She is febrile, tachycardic, but nontoxic in appearance here.  She does have tonsillar edema without exudates, this appears symmetric.  Uvula does not appear deviated and she is handling her secretions well.  Normal phonation without stridor.  Monospot is negative.  Her strep did come back positive today.  She was treated here with  Bicillin.  Her fever has reduced and heart rate has improved, still remains mildly tachycardic.  I suspect this is all related to fever.  She does not have any other clinical signs of infection and does not appear septic.  Feel she stable for discharge home with continued symptomatic care.  She will need to continue Tylenol/Motrin for fever control, symptoms should start gradually improving over the next few days.  Can follow-up with PCP.  Return here for any new or acute changes.  Final Clinical Impressions(s) / ED Diagnoses   Final diagnoses:  Strep throat    ED Discharge Orders         Ordered    lidocaine (XYLOCAINE) 2 % solution  Every 4 hours PRN     03/31/19 0505           Garlon Hatchet, PA-C 03/31/19 0507    Ward, Layla Maw, DO 03/31/19 8299

## 2019-03-31 NOTE — Telephone Encounter (Signed)
Pt caled asking for pharmacy change. Was unable to pick up ibuprofen and lidocaine from ER. Per Tressia Miners, okay to send both for patient to new pharmacy.

## 2019-03-31 NOTE — Discharge Instructions (Signed)
I have sent medications to your pharmacy.  Use as directed for pain. You need to continue tylenol or motrin for fever. Follow-up with your primary care doctor. Return to the ED for new or worsening symptoms.

## 2019-03-31 NOTE — ED Triage Notes (Signed)
Pt presents by [facility] for evaluation of sore throat. Pt was seen yesterday at Urgent care and was prescribed ibuprofen for pain. Pt denies any contact with sick persons.

## 2019-04-01 LAB — CULTURE, GROUP A STREP (THRC)

## 2019-04-03 ENCOUNTER — Telehealth (HOSPITAL_COMMUNITY): Payer: Self-pay | Admitting: Emergency Medicine

## 2019-04-03 NOTE — Telephone Encounter (Signed)
Strep positive in ER, given penicillin, called to check on her, pt feeling better.

## 2019-07-25 ENCOUNTER — Ambulatory Visit (HOSPITAL_COMMUNITY)
Admission: EM | Admit: 2019-07-25 | Discharge: 2019-07-25 | Disposition: A | Discharge status: Home / Self Care | Payer: Medicaid Other | Attending: Urgent Care | Admitting: Urgent Care

## 2019-07-25 ENCOUNTER — Encounter (HOSPITAL_COMMUNITY): Payer: Self-pay

## 2019-07-25 ENCOUNTER — Other Ambulatory Visit: Payer: Self-pay

## 2019-07-25 DIAGNOSIS — S39012A Strain of muscle, fascia and tendon of lower back, initial encounter: Secondary | ICD-10-CM

## 2019-07-25 DIAGNOSIS — M545 Low back pain, unspecified: Secondary | ICD-10-CM

## 2019-07-25 DIAGNOSIS — M62830 Muscle spasm of back: Secondary | ICD-10-CM

## 2019-07-25 MED ORDER — MELOXICAM 7.5 MG PO TABS: 7.5000 mg | ORAL_TABLET | Freq: Every day | ORAL | 0 refills | Status: DC

## 2019-07-25 MED ORDER — CYCLOBENZAPRINE HCL 5 MG PO TABS: 5.0000 mg | ORAL_TABLET | Freq: Every evening | ORAL | 0 refills | Status: DC | PRN

## 2019-07-25 NOTE — ED Provider Notes (Signed)
[facility]   MRN: [id number] DOB: [birth year]  Subjective:   Jamie Lawrence is a 19 y.o. female presenting for 5-6 week hx of persistent lower back pain that radiates upwards. Works in Systems analyst, has to do a lot heavy lifting of clothes/bags. Has tried muscle rub and Aleve with minimal relief. Drinks vitamin water.  Denies falls, trauma, weakness, numbness or tingling, dysuria, urinary frequency, hematuria, fever, nausea, vomiting, belly pain, constipation.  No current facility-administered medications for this encounter.  Current Outpatient Medications:  .  ibuprofen (ADVIL) 800 MG tablet, Take 1 tablet (800 mg total) by mouth every 8 (eight) hours as needed for fever, headache, mild pain, moderate pain or cramping., Disp: 21 tablet, Rfl: 0 .  lidocaine (XYLOCAINE) 2 % solution, Use as directed 15 mLs in the mouth or throat every 4 (four) hours as needed (sore throat)., Disp: 150 mL, Rfl: 0   No Known Allergies  Past Medical History:  Diagnosis Date  . Anxiety   . Bipolar depression ([place])   . Chlamydia infection affecting pregnancy 07/06/2017   tx on 07/04/17  . Depression   . Eating disorder   . GERD (gastroesophageal reflux disease)   . Hypertension in pregnancy, preeclampsia, severe, delivered 07/05/2017  . 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 IBS (irritable bowel syndrome)   . Insomnia   . Migraines   . Miscarriage March 2016  . Mononucleosis   . Mood disturbance   . 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 Vomiting      Past Surgical History:  Procedure Laterality Date  . COLONOSCOPY    . ESOPHAGOGASTRODUODENOSCOPY ENDOSCOPY      Family History  Problem Relation Age of Onset  . Hypertension Mother   . Hypertension Father   . Mental illness Father   . Hypertension Maternal Grandmother   . Hypertension Paternal Grandmother   . Hypertension Paternal Grandfather   . Diabetes Paternal Grandfather   . Brain cancer Maternal Aunt   . Colon cancer  Maternal Aunt     Social History   Tobacco Use  . Smoking status: Passive Smoke Exposure - Never Smoker  . Smokeless tobacco: Never Used  Substance Use Topics  . Alcohol use: No    Alcohol/week: 0.0 standard drinks  . Drug use: No    ROS As above.  Objective:   Vitals: BP (!) 142/79 (BP Location: Right Arm)   Pulse 90   Temp 98.8 F (37.1 C) (Oral)   Resp 16   LMP 06/13/2019   SpO2 100%   Physical Exam Constitutional:      General: She is not in acute distress.    Appearance: Normal appearance. She is well-developed. She is not ill-appearing, toxic-appearing or diaphoretic.  HENT:     Head: Normocephalic and atraumatic.     Nose: Nose normal.     Mouth/Throat:     Mouth: Mucous membranes are moist.     Pharynx: Oropharynx is clear.  Eyes:     General: No scleral icterus.    Extraocular Movements: Extraocular movements intact.     Pupils: Pupils are equal, round, and reactive to light.  Cardiovascular:     Rate and Rhythm: Normal rate.  Pulmonary:     Effort: Pulmonary effort is normal.  Musculoskeletal:     Lumbar back: Spasms and tenderness (Generalized throughout most of her lower back extending across to her flank sides) present. No swelling, edema, deformity, signs of trauma, lacerations or bony tenderness. Normal range of motion. Negative  right straight leg raise test and negative left straight leg raise test. No scoliosis.  Skin:    General: Skin is warm and dry.  Neurological:     General: No focal deficit present.     Mental Status: She is alert and oriented to person, place, and time.  Psychiatric:        Mood and Affect: Mood normal.        Behavior: Behavior normal.        Thought Content: Thought content normal.        Judgment: Judgment normal.     Assessment and Plan :   1. Strain of lumbar region, initial encounter   2. Acute bilateral low back pain without sciatica   3. Muscle spasm of back     Suspect lumbar strain and persistent  back spasms related to the nature of her work.  Will use meloxicam and Flexeril to help with this, work restrictions given for a week.  Recommended patient consider a different line of work as she is having persistent back issues now from the heavy lifting she has to do. Counseled patient on potential for adverse effects with medications prescribed/recommended today, ER and return-to-clinic precautions discussed, patient verbalized understanding.    Wallis Bamberg, PA-C 07/25/19 1747

## 2019-07-29 ENCOUNTER — Emergency Department (HOSPITAL_COMMUNITY)
Admission: EM | Admit: 2019-07-29 | Discharge: 2019-07-29 | Disposition: A | Discharge status: Home / Self Care | Payer: Medicaid Other | Attending: Emergency Medicine | Admitting: Emergency Medicine

## 2019-07-29 ENCOUNTER — Encounter (HOSPITAL_COMMUNITY): Payer: Self-pay | Admitting: *Deleted

## 2019-07-29 ENCOUNTER — Other Ambulatory Visit: Payer: Self-pay

## 2019-07-29 DIAGNOSIS — N390 Urinary tract infection, site not specified: Secondary | ICD-10-CM | POA: Diagnosis present

## 2019-07-29 DIAGNOSIS — S39012A Strain of muscle, fascia and tendon of lower back, initial encounter: Secondary | ICD-10-CM | POA: Insufficient documentation

## 2019-07-29 DIAGNOSIS — Y929 Unspecified place or not applicable: Secondary | ICD-10-CM | POA: Insufficient documentation

## 2019-07-29 DIAGNOSIS — S3992XA Unspecified injury of lower back, initial encounter: Secondary | ICD-10-CM | POA: Diagnosis present

## 2019-07-29 DIAGNOSIS — Y990 Civilian activity done for income or pay: Secondary | ICD-10-CM | POA: Diagnosis not present

## 2019-07-29 DIAGNOSIS — Z7722 Contact with and (suspected) exposure to environmental tobacco smoke (acute) (chronic): Secondary | ICD-10-CM | POA: Diagnosis not present

## 2019-07-29 DIAGNOSIS — X509XXA Other and unspecified overexertion or strenuous movements or postures, initial encounter: Secondary | ICD-10-CM | POA: Insufficient documentation

## 2019-07-29 DIAGNOSIS — Z79899 Other long term (current) drug therapy: Secondary | ICD-10-CM | POA: Diagnosis not present

## 2019-07-29 DIAGNOSIS — Y939 Activity, unspecified: Secondary | ICD-10-CM | POA: Insufficient documentation

## 2019-07-29 MED ORDER — KETOROLAC TROMETHAMINE 15 MG/ML IJ SOLN
15.0000 mg | Freq: Once | INTRAMUSCULAR | Status: AC
  Administered 2019-07-29: 11:00:00 15 mg via INTRAMUSCULAR
  Filled 2019-07-29: qty 1

## 2019-07-29 NOTE — ED Provider Notes (Signed)
[facility] DEPT Provider Note   CSN: [id number] Arrival date & time: 07/29/19  0859     History Chief Complaint  Patient presents with  . Back Pain    Jamie Lawrence is a 19 y.o. female.  Patient presents the emergency department with complaint of right-sided lower back pain.  Patient has been having pain for several weeks.  She went to urgent care on 1/1 and was prescribed Flexeril and meloxicam.  Patient states that she filled and is taking this.  She stayed out of work for couple of days.  At work today she lifted up a bag of laundry and had a sharp pain in her right lower back.  This is consistent with what she was seen for prior.  Pain is worse with movement such as twisting and flexing.  It does not radiate into her legs.  She is able to walk but it is uncomfortable.  Patient denies warning symptoms of back pain including: fecal incontinence, urinary retention or overflow incontinence, night sweats, waking from sleep with back pain, unexplained fevers or weight loss, h/o cancer, IVDU, recent trauma.           Past Medical History:  Diagnosis Date  . Anxiety   . Bipolar depression ([place])   . Chlamydia infection affecting pregnancy 07/06/2017   tx on 07/04/17  . Depression   . Eating disorder   . GERD (gastroesophageal reflux disease)   . Hypertension in pregnancy, preeclampsia, severe, delivered 07/05/2017  . 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 IBS (irritable bowel syndrome)   . Insomnia   . Migraines   . Miscarriage March 2016  . Mononucleosis   . Mood disturbance   . 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 Vomiting     Patient Active Problem List   Diagnosis Date Noted  . Lower urinary tract infectious disease 07/29/2019  . Chlamydia infection 08/23/2017  . Depression   . Panic attacks 07/07/2017  . MDD (major depressive disorder), single episode, severe , no psychosis ([place]) 12/08/2014  . GAD (generalized anxiety disorder)  12/08/2014  . Bulimia nervosa 12/08/2014    Past Surgical History:  Procedure Laterality Date  . COLONOSCOPY    . ESOPHAGOGASTRODUODENOSCOPY ENDOSCOPY       OB History    Gravida  2   Para  1   Term  0   Preterm  1   AB  1   Living  1     SAB  1   TAB  0   Ectopic  0   Multiple  0   Live Births  1           Family History  Problem Relation Age of Onset  . Hypertension Mother   . Hypertension Father   . Mental illness Father   . Hypertension Maternal Grandmother   . Hypertension Paternal Grandmother   . Hypertension Paternal Grandfather   . Diabetes Paternal Grandfather   . Brain cancer Maternal Aunt   . Colon cancer Maternal Aunt     Social History   Tobacco Use  . Smoking status: Passive Smoke Exposure - Never Smoker  . Smokeless tobacco: Never Used  Substance Use Topics  . Alcohol use: No    Alcohol/week: 0.0 standard drinks  . Drug use: No    Home Medications Prior to Admission medications   Medication Sig Start Date End Date Taking? Authorizing Provider  cyclobenzaprine (FLEXERIL) 5 MG tablet Take 1 tablet (5 mg total) by  mouth at bedtime as needed for muscle spasms. 07/25/19   Wallis Bamberg, PA-C  ibuprofen (ADVIL) 800 MG tablet Take 1 tablet (800 mg total) by mouth every 8 (eight) hours as needed for fever, headache, mild pain, moderate pain or cramping. 03/31/19   Bast, Gloris Manchester A, NP  lidocaine (XYLOCAINE) 2 % solution Use as directed 15 mLs in the mouth or throat every 4 (four) hours as needed (sore throat). 03/31/19   Dahlia Byes A, NP  meloxicam (MOBIC) 7.5 MG tablet Take 1-2 tablets (7.5-15 mg total) by mouth daily. 07/25/19   Wallis Bamberg, PA-C    Allergies    Patient has no known allergies.  Review of Systems   Review of Systems  Constitutional: Negative for fever and unexpected weight change.  Gastrointestinal: Negative for constipation.       Negative for fecal incontinence.   Genitourinary: Negative for dysuria, flank pain, hematuria,  pelvic pain, vaginal bleeding and vaginal discharge.       Negative for urinary incontinence or retention.  Musculoskeletal: Positive for back pain.  Neurological: Negative for weakness and numbness.       Denies saddle paresthesias.    Physical Exam Updated Vital Signs BP 125/80 (BP Location: Left Arm)   Pulse 90   Temp 99.1 F (37.3 C) (Oral)   Resp 16   Ht 4\' 11"  (1.499 m)   Wt 59.9 kg   BMI 26.66 kg/m   Physical Exam Vitals and nursing note reviewed.  Constitutional:      Appearance: She is well-developed.  HENT:     Head: Normocephalic and atraumatic.  Eyes:     Conjunctiva/sclera: Conjunctivae normal.  Pulmonary:     Effort: Pulmonary effort is normal.  Abdominal:     Palpations: Abdomen is soft.     Tenderness: There is no abdominal tenderness.  Musculoskeletal:     Cervical back: Normal, normal range of motion and neck supple.     Lumbar back: Tenderness present. No bony tenderness. Decreased range of motion.       Back:     Comments: No step-off noted with palpation of spine.   Skin:    General: Skin is warm and dry.     Findings: No rash.  Neurological:     Mental Status: She is alert.     Sensory: No sensory deficit.     Deep Tendon Reflexes: Reflexes are normal and symmetric.     Comments: 5/5 strength in entire lower extremities bilaterally. No sensation deficit.      ED Results / Procedures / Treatments   Labs (all labs ordered are listed, but only abnormal results are displayed) Labs Reviewed - No data to display  EKG None  Radiology No results found.  Procedures Procedures (including critical care time)  Medications Ordered in ED Medications  ketorolac (TORADOL) 15 MG/ML injection 15 mg (has no administration in time range)    ED Course  I have reviewed the triage vital signs and the nursing notes.  Pertinent labs & imaging results that were available during my care of the patient were reviewed by me and considered in my medical  decision making (see chart for details).  10:15 AM Patient seen and examined.   Vital signs reviewed and are as follows: Vitals:   07/29/19 0930  BP: 125/80  Pulse: 90  Resp: 16  Temp: 99.1 F (37.3 C)    No red flag s/s of low back pain. Patient was counseled on back pain precautions and  told to do activity as tolerated but do not lift, push, or pull heavy objects more than 10 pounds for the next week.  Patient counseled to use ice or heat on back for no longer than 15 minutes every hour.   Encourage patient to take the prescriptions given at previous urgent care visit.  Work note provided through the eighth.  Patient urged to follow-up with PCP if pain does not improve with treatment and rest or if pain becomes recurrent. Urged to return with worsening severe pain, loss of bowel or bladder control, trouble walking.   The patient verbalizes understanding and agrees with the plan.     MDM Rules/Calculators/A&P                      Patient with back pain, becoming chronic in nature.  History is strongly suggestive of musculoskeletal pain.  Patient likely reaggravated her back injury today by recurrent lifting.  No neurological deficits. Patient is ambulatory. No warning symptoms of back pain including: fecal incontinence, urinary retention or overflow incontinence, night sweats, waking from sleep with back pain, unexplained fevers or weight loss, h/o cancer, IVDU, recent trauma. No concern for cauda equina, epidural abscess, or other serious cause of back pain. Conservative measures such as rest, ice/heat and pain medicine indicated with PCP follow-up if no improvement with conservative management.    Final Clinical Impression(s) / ED Diagnoses Final diagnoses:  Strain of lumbar region, initial encounter    Rx / DC Orders ED Discharge Orders    None       Renne Crigler, PA-C 07/29/19 1017    Melene Plan, DO 07/29/19 1033

## 2019-07-29 NOTE — Discharge Instructions (Signed)
Please read and follow all provided instructions.  Your diagnoses today include:  1. Strain of lumbar region, initial encounter     Tests performed today include:  Vital signs - see below for your results today  Medications prescribed:   None  Take any prescribed medications only as directed.  Home care instructions:   Follow any educational materials contained in this packet  Please rest, use ice or heat on your back for the next several days  Do not lift, push, pull anything more than 10 pounds for the next week  Follow-up instructions: Please follow-up with your primary care provider in the next 1 week for further evaluation of your symptoms.   Return instructions:  SEEK IMMEDIATE MEDICAL ATTENTION IF YOU HAVE:  New numbness, tingling, weakness, or problem with the use of your arms or legs  Severe back pain not relieved with medications  Loss control of your bowels or bladder  Increasing pain in any areas of the body (such as chest or abdominal pain)  Shortness of breath, dizziness, or fainting.   Worsening nausea (feeling sick to your stomach), vomiting, fever, or sweats  Any other emergent concerns regarding your health   Additional Information:  Your vital signs today were: BP 125/80 (BP Location: Left Arm)    Pulse 90    Temp 99.1 F (37.3 C) (Oral)    Resp 16    Ht 4\' 11"  (1.499 m)    Wt 59.9 kg    BMI 26.66 kg/m  If your blood pressure (BP) was elevated above 135/85 this visit, please have this repeated by your doctor within one month. --------------

## 2019-07-29 NOTE — ED Triage Notes (Signed)
States she was at work this morning, lifted a bag of laundry, then back started hurting.

## 2020-03-26 ENCOUNTER — Encounter (HOSPITAL_COMMUNITY): Payer: Self-pay | Admitting: *Deleted

## 2020-03-26 ENCOUNTER — Emergency Department (HOSPITAL_COMMUNITY)
Admission: EM | Admit: 2020-03-26 | Discharge: 2020-03-26 | Disposition: A | Discharge status: Home / Self Care | Payer: Medicaid Other | Attending: Emergency Medicine | Admitting: Emergency Medicine

## 2020-03-26 ENCOUNTER — Other Ambulatory Visit: Payer: Self-pay

## 2020-03-26 DIAGNOSIS — R519 Headache, unspecified: Secondary | ICD-10-CM | POA: Diagnosis present

## 2020-03-26 DIAGNOSIS — Z5321 Procedure and treatment not carried out due to patient leaving prior to being seen by health care provider: Secondary | ICD-10-CM | POA: Insufficient documentation

## 2020-03-26 NOTE — ED Triage Notes (Signed)
Pt arrives via [facility] with c/o  n/v/headache started about 5 am this morning. Denies COVID exposure. 154/90, hr 90, 99% RA. 97.6

## 2020-03-26 NOTE — ED Triage Notes (Signed)
Pt states she woke up and had a bad headache. Vomited x 2. Light sensitivity, blurry vision. No meds PTA. Hx of migraines, but this feels different.

## 2020-03-28 ENCOUNTER — Encounter (HOSPITAL_COMMUNITY): Payer: Self-pay | Admitting: Emergency Medicine

## 2020-03-28 ENCOUNTER — Other Ambulatory Visit: Payer: Self-pay

## 2020-03-28 ENCOUNTER — Ambulatory Visit (HOSPITAL_COMMUNITY)
Admission: EM | Admit: 2020-03-28 | Discharge: 2020-03-28 | Disposition: A | Discharge status: Home / Self Care | Payer: Medicaid Other | Attending: Family Medicine | Admitting: Family Medicine

## 2020-03-28 DIAGNOSIS — H6690 Otitis media, unspecified, unspecified ear: Secondary | ICD-10-CM | POA: Diagnosis not present

## 2020-03-28 MED ORDER — IBUPROFEN 600 MG PO TABS: 600.0000 mg | ORAL_TABLET | Freq: Three times a day (TID) | ORAL | 0 refills | Status: DC | PRN

## 2020-03-28 MED ORDER — AMOXICILLIN 875 MG PO TABS: 875.0000 mg | ORAL_TABLET | Freq: Two times a day (BID) | ORAL | 0 refills | Status: AC

## 2020-03-28 MED ORDER — FLUTICASONE PROPIONATE 50 MCG/ACT NA SUSP: 1.0000 | Freq: Every day | NASAL | 2 refills | Status: DC

## 2020-03-28 NOTE — ED Provider Notes (Signed)
[facility]    CSN: [id number] Arrival date & time: 03/28/20  1021      History   Chief Complaint Chief Complaint  Patient presents with   Otalgia    HPI Jamie Lawrence is a 19 y.o. female.   Patient is a 19 year old female who presents today with right ear pain.  This is been constant since last night.  Mild decreased hearing in the ear.  Denies any external ear pain, draining or recent swimming.  No fevers, nasal congestion or rhinorrhea.     Past Medical History:  Diagnosis Date   Anxiety    Bipolar depression ([facility])    Chlamydia infection affecting pregnancy 07/06/2017   tx on 07/04/17   Depression    Eating disorder    GERD (gastroesophageal reflux disease)    Hypertension in pregnancy, preeclampsia, severe, delivered 07/05/2017   Hypomania ([facility])    IBS (irritable bowel syndrome)    Insomnia    Migraines    Miscarriage March 2016   Mononucleosis    Mood disturbance    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   Vomiting     Patient Active Problem List   Diagnosis Date Noted   Lower urinary tract infectious disease 07/29/2019   Chlamydia infection 08/23/2017   Depression    Panic attacks 07/07/2017   MDD (major depressive disorder), single episode, severe , no psychosis ([facility]) 12/08/2014   GAD (generalized anxiety disorder) 12/08/2014   Bulimia nervosa 12/08/2014    Past Surgical History:  Procedure Laterality Date   COLONOSCOPY     ESOPHAGOGASTRODUODENOSCOPY ENDOSCOPY      OB History    Gravida  2   Para  1   Term  0   Preterm  1   AB  1   Living  1     SAB  1   TAB  0   Ectopic  0   Multiple  0   Live Births  1            Home Medications    Prior to Admission medications   Medication Sig Start Date End Date Taking? Authorizing Provider  amoxicillin (AMOXIL) 875 MG tablet Take 1 tablet (875 mg total) by mouth in the morning and at bedtime for 5 days. 03/28/20 04/02/20  Dahlia Byes A, NP  fluticasone (FLONASE) 50 MCG/ACT nasal spray Place 1 spray into both nostrils daily. 03/28/20   Dahlia Byes A, NP  ibuprofen (ADVIL) 600 MG tablet Take 1 tablet (600 mg total) by mouth every 8 (eight) hours as needed for moderate pain. 03/28/20   Janace Aris, NP    Family History Family History  Problem Relation Age of Onset   Hypertension Mother    Hypertension Father    Mental illness Father    Hypertension Maternal Grandmother    Hypertension Paternal Grandmother    Hypertension Paternal Grandfather    Diabetes Paternal Grandfather    Brain cancer Maternal Aunt    Colon cancer Maternal Aunt     Social History Social History   Tobacco Use   Smoking status: Passive Smoke Exposure - Never Smoker   Smokeless tobacco: Never Used  Building services engineer Use: Never used  Substance Use Topics   Alcohol use: No    Alcohol/week: 0.0 standard drinks   Drug use: No     Allergies   Patient has no known allergies.   Review of Systems Review of Systems   Physical Exam Triage  Vital Signs ED Triage Vitals  Enc Vitals Group     BP 03/28/20 1159 120/79     Pulse Rate 03/28/20 1159 (!) 104     Resp 03/28/20 1159 17     Temp 03/28/20 1159 98.5 F (36.9 C)     Temp Source 03/28/20 1159 Oral     SpO2 03/28/20 1159 98 %     Weight --      Height --      Head Circumference --      Peak Flow --      Pain Score 03/28/20 1156 8     Pain Loc --      Pain Edu? --      Excl. in GC? --    No data found.  Updated Vital Signs BP 120/79 (BP Location: Right Arm)    Pulse (!) 104    Temp 98.5 F (36.9 C) (Oral)    Resp 17    LMP 03/09/2020    SpO2 98%   Visual Acuity Right Eye Distance:   Left Eye Distance:   Bilateral Distance:    Right Eye Near:   Left Eye Near:    Bilateral Near:     Physical Exam Vitals and nursing note reviewed.  Constitutional:      General: She is not in acute distress.    Appearance: Normal appearance. She is not  ill-appearing, toxic-appearing or diaphoretic.  HENT:     Head: Normocephalic.     Right Ear: Ear canal and external ear normal. Tympanic membrane is erythematous and bulging.     Left Ear: Tympanic membrane, ear canal and external ear normal.     Nose: Nose normal.  Eyes:     Conjunctiva/sclera: Conjunctivae normal.  Pulmonary:     Effort: Pulmonary effort is normal.  Musculoskeletal:        General: Normal range of motion.     Cervical back: Normal range of motion.  Skin:    General: Skin is warm and dry.     Findings: No rash.  Neurological:     Mental Status: She is alert.  Psychiatric:        Mood and Affect: Mood normal.      UC Treatments / Results  Labs (all labs ordered are listed, but only abnormal results are displayed) Labs Reviewed - No data to display  EKG   Radiology No results found.  Procedures Procedures (including critical care time)  Medications Ordered in UC Medications - No data to display  Initial Impression / Assessment and Plan / UC Course  I have reviewed the triage vital signs and the nursing notes.  Pertinent labs & imaging results that were available during my care of the patient were reviewed by me and considered in my medical decision making (see chart for details).     Acute otitis media Treated with amoxicillin. Flonase nasal spray ibuprofen Follow up as needed for continued or worsening symptoms  Final Clinical Impressions(s) / UC Diagnoses   Final diagnoses:  Acute otitis media, unspecified otitis media type     Discharge Instructions     Medication as prescribed. Follow up as needed for continued or worsening symptoms     ED Prescriptions    Medication Sig Dispense Auth. Provider   fluticasone (FLONASE) 50 MCG/ACT nasal spray Place 1 spray into both nostrils daily. 16 g Hannie Shoe A, NP   ibuprofen (ADVIL) 600 MG tablet Take 1 tablet (600 mg total) by mouth every  8 (eight) hours as needed for moderate pain. 30  tablet Mizraim Harmening A, NP   amoxicillin (AMOXIL) 875 MG tablet Take 1 tablet (875 mg total) by mouth in the morning and at bedtime for 5 days. 10 tablet Dahlia Byes A, NP     PDMP not reviewed this encounter.   Dahlia Byes A, NP 03/28/20 1220

## 2020-03-28 NOTE — ED Triage Notes (Signed)
Right ear pain started last night.  Denies placing anything in ear.  Ear hurts with sneezing or hiccup.  Denies any other symptoms.

## 2020-03-28 NOTE — Discharge Instructions (Addendum)
Medication as prescribed Follow up as needed for continued or worsening symptoms  

## 2020-05-22 ENCOUNTER — Other Ambulatory Visit: Payer: Self-pay

## 2020-05-22 ENCOUNTER — Emergency Department (HOSPITAL_COMMUNITY): Payer: Medicaid Other

## 2020-05-22 ENCOUNTER — Emergency Department (HOSPITAL_COMMUNITY)
Admission: EM | Admit: 2020-05-22 | Discharge: 2020-05-23 | Disposition: A | Discharge status: Home / Self Care | Payer: Medicaid Other | Attending: Emergency Medicine | Admitting: Emergency Medicine

## 2020-05-22 ENCOUNTER — Encounter (HOSPITAL_COMMUNITY): Payer: Self-pay | Admitting: Emergency Medicine

## 2020-05-22 DIAGNOSIS — Z20822 Contact with and (suspected) exposure to covid-19: Secondary | ICD-10-CM | POA: Insufficient documentation

## 2020-05-22 DIAGNOSIS — R110 Nausea: Secondary | ICD-10-CM | POA: Insufficient documentation

## 2020-05-22 DIAGNOSIS — R509 Fever, unspecified: Secondary | ICD-10-CM | POA: Insufficient documentation

## 2020-05-22 DIAGNOSIS — Z7722 Contact with and (suspected) exposure to environmental tobacco smoke (acute) (chronic): Secondary | ICD-10-CM | POA: Insufficient documentation

## 2020-05-22 DIAGNOSIS — R1031 Right lower quadrant pain: Secondary | ICD-10-CM | POA: Insufficient documentation

## 2020-05-22 DIAGNOSIS — R109 Unspecified abdominal pain: Secondary | ICD-10-CM | POA: Diagnosis present

## 2020-05-22 DIAGNOSIS — R102 Pelvic and perineal pain: Secondary | ICD-10-CM

## 2020-05-22 LAB — URINALYSIS, ROUTINE W REFLEX MICROSCOPIC
Bilirubin Urine: NEGATIVE
Glucose, UA: NEGATIVE mg/dL
Ketones, ur: NEGATIVE mg/dL
Nitrite: NEGATIVE
Protein, ur: 30 mg/dL — AB
Specific Gravity, Urine: 1.016 (ref 1.005–1.030)
pH: 7 (ref 5.0–8.0)

## 2020-05-22 LAB — COMPREHENSIVE METABOLIC PANEL
ALT: 14 U/L (ref 0–44)
AST: 16 U/L (ref 15–41)
Albumin: 4.4 g/dL (ref 3.5–5.0)
Alkaline Phosphatase: 86 U/L (ref 38–126)
Anion gap: 11 (ref 5–15)
BUN: 10 mg/dL (ref 6–20)
CO2: 22 mmol/L (ref 22–32)
Calcium: 8.9 mg/dL (ref 8.9–10.3)
Chloride: 103 mmol/L (ref 98–111)
Creatinine, Ser: 0.74 mg/dL (ref 0.44–1.00)
GFR, Estimated: >60 mL/min (ref >60)
Glucose, Bld: 97 mg/dL (ref 70–99)
Potassium: 3.9 mmol/L (ref 3.5–5.1)
Sodium: 136 mmol/L (ref 135–145)
Total Bilirubin: 0.6 mg/dL (ref 0.3–1.2)
Total Protein: 8.2 g/dL — ABNORMAL HIGH (ref 6.5–8.1)

## 2020-05-22 LAB — CBC WITH DIFFERENTIAL/PLATELET
Abs Immature Granulocytes: 0.08 10*3/uL — ABNORMAL HIGH (ref 0.00–0.07)
Basophils Absolute: 0.1 10*3/uL (ref 0.0–0.1)
Basophils Relative: 1 %
Eosinophils Absolute: 0.1 10*3/uL (ref 0.0–0.5)
Eosinophils Relative: 1 %
HCT: 39.3 % (ref 36.0–46.0)
Hemoglobin: 13.1 g/dL (ref 12.0–15.0)
Immature Granulocytes: 1 %
Lymphocytes Relative: 28 %
Lymphs Abs: 2.8 10*3/uL (ref 0.7–4.0)
MCH: 28.7 pg (ref 26.0–34.0)
MCHC: 33.3 g/dL (ref 30.0–36.0)
MCV: 86 fL (ref 80.0–100.0)
Monocytes Absolute: 1.1 10*3/uL — ABNORMAL HIGH (ref 0.1–1.0)
Monocytes Relative: 11 %
Neutro Abs: 6 10*3/uL (ref 1.7–7.7)
Neutrophils Relative %: 58 %
Platelets: 347 10*3/uL (ref 150–400)
RBC: 4.57 MIL/uL (ref 3.87–5.11)
RDW: 13.7 % (ref 11.5–15.5)
WBC: 10.1 10*3/uL (ref 4.0–10.5)
nRBC: 0 % (ref 0.0–0.2)

## 2020-05-22 LAB — WET PREP, GENITAL
Clue Cells Wet Prep HPF POC: NONE SEEN
Sperm: NONE SEEN
Trich, Wet Prep: NONE SEEN
Yeast Wet Prep HPF POC: NONE SEEN

## 2020-05-22 LAB — LACTIC ACID, PLASMA: Lactic Acid, Venous: 1.6 mmol/L (ref 0.5–1.9)

## 2020-05-22 LAB — I-STAT BETA HCG BLOOD, ED (MC, WL, AP ONLY): I-stat hCG, quantitative: <5 m[IU]/mL (ref <5)

## 2020-05-22 MED ORDER — ONDANSETRON HCL 4 MG/2ML IJ SOLN
4.0000 mg | Freq: Once | INTRAMUSCULAR | Status: AC
  Administered 2020-05-22: 4 mg via INTRAVENOUS
  Filled 2020-05-22: qty 2

## 2020-05-22 MED ORDER — SODIUM CHLORIDE 0.9 % IV BOLUS
1000.0000 mL | Freq: Once | INTRAVENOUS | Status: AC
  Administered 2020-05-22: 1000 mL via INTRAVENOUS

## 2020-05-22 MED ORDER — IOHEXOL 300 MG/ML  SOLN
100.0000 mL | Freq: Once | INTRAMUSCULAR | Status: AC | PRN
  Administered 2020-05-22: 100 mL via INTRAVENOUS

## 2020-05-22 MED ORDER — KETOROLAC TROMETHAMINE 15 MG/ML IJ SOLN
30.0000 mg | Freq: Once | INTRAMUSCULAR | Status: AC
  Administered 2020-05-22: 30 mg via INTRAVENOUS
  Filled 2020-05-22: qty 2

## 2020-05-22 MED ORDER — MORPHINE SULFATE (PF) 4 MG/ML IV SOLN
4.0000 mg | Freq: Once | INTRAVENOUS | Status: AC
  Administered 2020-05-22: 4 mg via INTRAVENOUS
  Filled 2020-05-22: qty 1

## 2020-05-22 MED ORDER — FENTANYL CITRATE (PF) 100 MCG/2ML IJ SOLN
50.0000 ug | Freq: Once | INTRAMUSCULAR | Status: AC
  Administered 2020-05-22: 50 ug via INTRAVENOUS
  Filled 2020-05-22: qty 2

## 2020-05-22 MED ORDER — ACETAMINOPHEN 500 MG PO TABS
1000.0000 mg | ORAL_TABLET | Freq: Once | ORAL | Status: AC
  Administered 2020-05-22: 1000 mg via ORAL
  Filled 2020-05-22: qty 2

## 2020-05-22 MED ORDER — SODIUM CHLORIDE (PF) 0.9 % IJ SOLN
INTRAMUSCULAR | Status: AC
  Filled 2020-05-22: qty 50

## 2020-05-22 NOTE — ED Triage Notes (Signed)
Patient c/o RLQ pain since yesterday. Febrile and tachycardic in triage.

## 2020-05-22 NOTE — ED Provider Notes (Signed)
[facility]-EMERGENCY DEPT Provider Note   CSN: [id number] Arrival date & time: 05/22/20  1826     History Chief Complaint  Patient presents with  . Abdominal Pain    Jamie Lawrence is a 19 y.o. female  PMH/o of GERD, hypertension, IBS who presents for evaluation of abdominal pain that occurred yesterday.  She reports that yesterday, she started developing some periumbilical abdominal pain.  She states it started radiating to her whole right side.  She ate she felt nauseous all day but did not have any vomiting.  She states she has had decreased appetite and has not really eaten anything since yesterday.  No diarrhea.  She states that when she would urinate, would make the abdominal pain worse but did not have any dysuria or hematuria.  She went to work today and states that while at work, her pain became more severe and more located on the right side.  Her manager prompted her to go to the emergency department.  She is currently sexually active.  She does not use protection.  Her last menstrual cycle was on 3 October.  She has not had any vaginal discharge. She had a little vaginal spotting this AM but hasn't had any more vaginal bleeding.  She denies any history of abdominal surgeries.  She has not noted any fever, congestion, rhinorrhea, chest pain, difficulty breathing.  She has not gotten vaccinated for Covid.  No known Covid exposure that she knows of.  The history is provided by the patient.       Past Medical History:  Diagnosis Date  . Anxiety   . Bipolar depression (HCC)   . Chlamydia infection affecting pregnancy 07/06/2017   tx on 07/04/17  . Depression   . Eating disorder   . GERD (gastroesophageal reflux disease)   . Hypertension in pregnancy, preeclampsia, severe, delivered 07/05/2017  . Hypomania (HCC)   . IBS (irritable bowel syndrome)   . Insomnia   . Migraines   . Miscarriage March 2016  . Mononucleosis   . Mood disturbance   .  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 Vomiting     Patient Active Problem List   Diagnosis Date Noted  . Lower urinary tract infectious disease 07/29/2019  . Chlamydia infection 08/23/2017  . Depression   . Panic attacks 07/07/2017  . 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 GAD (generalized anxiety disorder) 12/08/2014  . Bulimia nervosa 12/08/2014    Past Surgical History:  Procedure Laterality Date  . COLONOSCOPY    . ESOPHAGOGASTRODUODENOSCOPY ENDOSCOPY       OB History    Gravida  2   Para  1   Term  0   Preterm  1   AB  1   Living  1     SAB  1   TAB  0   Ectopic  0   Multiple  0   Live Births  1           Family History  Problem Relation Age of Onset  . Hypertension Mother   . Hypertension Father   . Mental illness Father   . Hypertension Maternal Grandmother   . Hypertension Paternal Grandmother   . Hypertension Paternal Grandfather   . Diabetes Paternal Grandfather   . Brain cancer Maternal Aunt   . Colon cancer Maternal Aunt     Social History   Tobacco Use  . Smoking status: Passive Smoke  Exposure - Never Smoker  . Smokeless tobacco: Never Used  Vaping Use  . Vaping Use: Never used  Substance Use Topics  . Alcohol use: No    Alcohol/week: 0.0 standard drinks  . Drug use: No    Home Medications Prior to Admission medications   Medication Sig Start Date End Date Taking? Authorizing Provider  fluticasone (FLONASE) 50 MCG/ACT nasal spray Place 1 spray into both nostrils daily. 03/28/20  Yes Bast, Traci A, NP  ibuprofen (ADVIL) 600 MG tablet Take 1 tablet (600 mg total) by mouth every 8 (eight) hours as needed for moderate pain. 03/28/20  Yes Bast, Traci A, NP  ondansetron (ZOFRAN) 4 MG tablet Take 4 mg by mouth every 8 (eight) hours as needed for nausea or vomiting.  03/31/20  Yes [provider]    Allergies    Patient has no known allergies.  Review of  Systems   Review of Systems  Constitutional: Positive for activity change. Negative for fever.  Respiratory: Negative for cough and shortness of breath.   Cardiovascular: Negative for chest pain.  Gastrointestinal: Positive for abdominal pain and nausea. Negative for vomiting.  Genitourinary: Negative for dysuria and hematuria.  Neurological: Negative for headaches.  All other systems reviewed and are negative.   Physical Exam Updated Vital Signs BP 111/73 (BP Location: Right Arm)   Pulse 98   Temp 98.5 F (36.9 C) (Oral)   Resp 12   Ht 4\' 11"  (1.499 m)   Wt 59 kg   LMP 04/22/2020   SpO2 97%   BMI 26.26 kg/m   Physical Exam Vitals and nursing note reviewed. Exam conducted with a chaperone present.  Constitutional:      Appearance: Normal appearance. She is well-developed.     Comments: Appears uncomfortable.   HENT:     Head: Normocephalic and atraumatic.  Eyes:     General: Lids are normal.     Conjunctiva/sclera: Conjunctivae normal.     Pupils: Pupils are equal, round, and reactive to light.  Cardiovascular:     Rate and Rhythm: Normal rate and regular rhythm.     Pulses: Normal pulses.     Heart sounds: Normal heart sounds. No murmur heard.  No friction rub. No gallop.   Pulmonary:     Effort: Pulmonary effort is normal.     Breath sounds: Normal breath sounds.     Comments: Lungs clear to auscultation bilaterally.  Symmetric chest rise.  No wheezing, rales, rhonchi. Abdominal:     Palpations: Abdomen is soft. Abdomen is not rigid.     Tenderness: There is abdominal tenderness in the right upper quadrant and right lower quadrant. There is no guarding. Positive signs include McBurney's sign.     Comments: Abdomen soft, nondistended.  Tenderness palpation mid abdomen that extends to the right lower quadrant.  She does have some focal tenderness noted McBurney's point as well as positive obturator sign.  No CVA tenderness.  Genitourinary:    Comments: The exam was  performed with a chaperone present Lamar Laundry(Sonya, RN).  Small amount of vaginal bleeding noted in vaginal vault.  No CMT.  No obvious cervical discharge.  Right adnexal tenderness.  No mass palpated.  No left adnexal mass or tenderness noted. Musculoskeletal:        General: Normal range of motion.     Cervical back: Full passive range of motion without pain.  Skin:    General: Skin is warm and dry.     Capillary Refill:  Capillary refill takes less than 2 seconds.  Neurological:     Mental Status: She is alert and oriented to person, place, and time.  Psychiatric:        Speech: Speech normal.     ED Results / Procedures / Treatments   Labs (all labs ordered are listed, but only abnormal results are displayed) Labs Reviewed  COMPREHENSIVE METABOLIC PANEL - Abnormal; Notable for the following components:      Result Value   Total Protein 8.2 (*)    All other components within normal limits  CBC WITH DIFFERENTIAL/PLATELET - Abnormal; Notable for the following components:   Monocytes Absolute 1.1 (*)    Abs Immature Granulocytes 0.08 (*)    All other components within normal limits  URINALYSIS, ROUTINE W REFLEX MICROSCOPIC - Abnormal; Notable for the following components:   APPearance CLOUDY (*)    Hgb urine dipstick MODERATE (*)    Protein, ur 30 (*)    Leukocytes,Ua TRACE (*)    Bacteria, UA FEW (*)    All other components within normal limits  RESPIRATORY PANEL BY RT PCR (FLU A&B, COVID)  WET PREP, GENITAL  LACTIC ACID, PLASMA  I-STAT BETA HCG BLOOD, ED ([facility], [facility], [facility] ONLY)  GC/CHLAMYDIA PROBE AMP ([place]) NOT AT [facility]    EKG None  Radiology CT ABDOMEN PELVIS W CONTRAST  Result Date: 05/22/2020 CLINICAL DATA:  Abdominal pain, fever, right upper quadrant pain EXAM: CT ABDOMEN AND PELVIS WITH CONTRAST TECHNIQUE: Multidetector CT imaging of the abdomen and pelvis was performed using the standard protocol following bolus administration of intravenous contrast. CONTRAST:   OMNIPAQUE IOHEXOL 300 MG/ML  SOLN COMPARISON:  None. FINDINGS: Lower chest: Lung bases are clear. No effusions. Heart is normal size. Hepatobiliary: No focal hepatic abnormality. Gallbladder unremarkable. Pancreas: No focal abnormality or ductal dilatation. Spleen: No focal abnormality.  Normal size. Adrenals/Urinary Tract: No adrenal abnormality. No focal renal abnormality. No stones or hydronephrosis. Urinary bladder is unremarkable. Stomach/Bowel: Normal appendix. Stomach, large and small bowel grossly unremarkable. Vascular/Lymphatic: No evidence of aneurysm or adenopathy. Reproductive: Uterus and adnexa unremarkable.  No mass. Other: Trace free fluid in the pelvis.  No free air. Musculoskeletal: No acute bony abnormality. IMPRESSION: No acute findings in the abdomen or pelvis. Electronically Signed   By: Charlett Nose M.D.   On: 05/22/2020 20:08    Procedures Procedures (including critical care time)  Medications Ordered in ED Medications  sodium chloride (PF) 0.9 % injection (has no administration in time range)  acetaminophen (TYLENOL) tablet 1,000 mg (1,000 mg Oral Given 05/22/20 2019)  ondansetron (ZOFRAN) injection 4 mg (4 mg Intravenous Given 05/22/20 2021)  sodium chloride 0.9 % bolus 1,000 mL (1,000 mLs Intravenous New Bag/Given 05/22/20 2019)  morphine 4 MG/ML injection 4 mg (4 mg Intravenous Given 05/22/20 2019)  iohexol (OMNIPAQUE) 300 MG/ML solution 100 mL (100 mLs Intravenous Contrast Given 05/22/20 1958)  fentaNYL (SUBLIMAZE) injection 50 mcg (50 mcg Intravenous Given 05/22/20 2209)  ondansetron (ZOFRAN) injection 4 mg (4 mg Intravenous Given 05/22/20 2209)    ED Course  I have reviewed the triage vital signs and the nursing notes.  Pertinent labs & imaging results that were available during my care of the patient were reviewed by me and considered in my medical decision making (see chart for details).    MDM Rules/Calculators/A&P                          19 year old  female who presents for evaluation of abdominal pain that began yesterday.  She reports that initially started in the periumbilical region and started spreading down towards the right lower side.  She states she has had nausea but no vomiting.  She states that she has not really been able to eat much since onset of pain.  She has not had any dysuria but does report that urinating makes her abdominal pain worse.  She states her LMP was October 3.  She had a little bit of vaginal spotting this morning but has not since had any vaginal bleeding.  She does report that she has not been vaccinated for Covid.  No known Covid exposure.  Initially arrival, she is febrile, tachycardic and hypertensive.  She appears uncomfortable.  On exam, she has tenderness palpation of the right lower quadrant with positive obturator sign.  Concern for appendicitis versus infectious etiology.  Doubt kidney stone.  We will plan for lab work, CT scan.  I-STAT beta negative.  CBC shows no leukocytosis or anemia.  CMP shows normal BUN and creatinine.  Lactic is normal.  Urine shows moderate hemoglobin, trace leukocytes, pyuria, bacteria.  CT scan shows No acute findings.  Appendix is visualized and is normal.  No evidence of hydronephrosis or stones.  Pelvic exam as documented above.  Patient tolerated procedure.  No CMT that would be concerning for PID.  She does some small amount of bleeding noted in the vaginal vault.  No discharge from the cervix.  She also had right adnexal tenderness.    At this time, she is still tachycardic with heart rate in the 118's.  She is still having pain.  Given unclear etiology of her symptoms, will plan for ultrasound.  Patient signed out to Sharilyn Sites, PA-C with ultrasound pending.    Final Clinical Impression(s) / ED Diagnoses Final diagnoses:  Right lower quadrant abdominal pain  Fever, unspecified fever cause    Rx / DC Orders ED Discharge Orders    None       Maxwell Caul,  PA-C 05/22/20 2250    Arby Barrette, MD 05/23/20 1621

## 2020-05-23 ENCOUNTER — Emergency Department (HOSPITAL_COMMUNITY): Payer: Medicaid Other

## 2020-05-23 LAB — RESPIRATORY PANEL BY RT PCR (FLU A&B, COVID)
Influenza A by PCR: NEGATIVE
Influenza B by PCR: NEGATIVE
SARS Coronavirus 2 by RT PCR: NEGATIVE

## 2020-05-23 MED ORDER — PHENAZOPYRIDINE HCL 200 MG PO TABS: 200.0000 mg | ORAL_TABLET | Freq: Three times a day (TID) | ORAL | 0 refills | Status: DC | PRN

## 2020-05-23 MED ORDER — CEPHALEXIN 500 MG PO CAPS
500.0000 mg | ORAL_CAPSULE | Freq: Once | ORAL | Status: AC
  Administered 2020-05-23: 500 mg via ORAL
  Filled 2020-05-23: qty 1

## 2020-05-23 MED ORDER — CEPHALEXIN 500 MG PO CAPS: 500.0000 mg | ORAL_CAPSULE | Freq: Three times a day (TID) | ORAL | 0 refills | Status: DC

## 2020-05-23 NOTE — ED Provider Notes (Signed)
Assumed care from PA Layden at shift change.  See prior notes for full H&P.  Briefly, 19 y.o. F here with lower abdominal pain and fever.  Non-toxic here.  Labs grossly reassuring.  CT obtained and is negative for acute findings.  Pelvic exam done, right adnexal tenderness present.  Wet prep negative.  Gc/chl pending.  UA with 21-50 WBC, few bacteria, tracks leuks.    Plan:  Pelvic US pending.  If no acute findings, will treat for UTI as an OP.  Results for orders placed or performed during the hospital encounter of 05/22/20  Wet prep, genital   Specimen: Nasopharyngeal Swab  Result Value Ref Range   Yeast Wet Prep HPF POC NONE SEEN NONE SEEN   Trich, Wet Prep NONE SEEN NONE SEEN   Clue Cells Wet Prep HPF POC NONE SEEN NONE SEEN   WBC, Wet Prep HPF POC FEW (A) NONE SEEN   Sperm NONE SEEN   Lactic acid, plasma  Result Value Ref Range   Lactic Acid, Venous 1.6 0.5 - 1.9 mmol/L  Comprehensive metabolic panel  Result Value Ref Range   Sodium 136 135 - 145 mmol/L   Potassium 3.9 3.5 - 5.1 mmol/L   Chloride 103 98 - 111 mmol/L   CO2 22 22 - 32 mmol/L   Glucose, Bld 97 70 - 99 mg/dL   BUN 10 6 - 20 mg/dL   Creatinine, Ser 3.24 0.44 - 1.00 mg/dL   Calcium 8.9 8.9 - 40.1 mg/dL   Total Protein 8.2 (H) 6.5 - 8.1 g/dL   Albumin 4.4 3.5 - 5.0 g/dL   AST 16 15 - 41 U/L   ALT 14 0 - 44 U/L   Alkaline Phosphatase 86 38 - 126 U/L   Total Bilirubin 0.6 0.3 - 1.2 mg/dL   GFR, Estimated >02 >72 mL/min   Anion gap 11 5 - 15  CBC with Differential  Result Value Ref Range   WBC 10.1 4.0 - 10.5 K/uL   RBC 4.57 3.87 - 5.11 MIL/uL   Hemoglobin 13.1 12.0 - 15.0 g/dL   HCT 53.6 36 - 46 %   MCV 86.0 80.0 - 100.0 fL   MCH 28.7 26.0 - 34.0 pg   MCHC 33.3 30.0 - 36.0 g/dL   RDW 64.4 03.4 - 74.2 %   Platelets 347 150 - 400 K/uL   nRBC 0.0 0.0 - 0.2 %   Neutrophils Relative % 58 %   Neutro Abs 6.0 1.7 - 7.7 K/uL   Lymphocytes Relative 28 %   Lymphs Abs 2.8 0.7 - 4.0 K/uL   Monocytes Relative 11 %    Monocytes Absolute 1.1 (H) 0.1 - 1.0 K/uL   Eosinophils Relative 1 %   Eosinophils Absolute 0.1 0.0 - 0.5 K/uL   Basophils Relative 1 %   Basophils Absolute 0.1 0.0 - 0.1 K/uL   Immature Granulocytes 1 %   Abs Immature Granulocytes 0.08 (H) 0.00 - 0.07 K/uL  Urinalysis, Routine w reflex microscopic Urine, Clean Catch  Result Value Ref Range   Color, Urine YELLOW YELLOW   APPearance CLOUDY (A) CLEAR   Specific Gravity, Urine 1.016 1.005 - 1.030   pH 7.0 5.0 - 8.0   Glucose, UA NEGATIVE NEGATIVE mg/dL   Hgb urine dipstick MODERATE (A) NEGATIVE   Bilirubin Urine NEGATIVE NEGATIVE   Ketones, ur NEGATIVE NEGATIVE mg/dL   Protein, ur 30 (A) NEGATIVE mg/dL   Nitrite NEGATIVE NEGATIVE   Leukocytes,Ua TRACE (A) NEGATIVE   RBC /  HPF 11-20 0 - 5 RBC/hpf   WBC, UA 21-50 0 - 5 WBC/hpf   Bacteria, UA FEW (A) NONE SEEN   Squamous Epithelial / LPF 0-5 0 - 5   Mucus PRESENT   I-Stat beta hCG blood, ED  Result Value Ref Range   I-stat hCG, quantitative <5.0 <5 mIU/mL   Comment 3           CT ABDOMEN PELVIS W CONTRAST  Result Date: 05/22/2020 CLINICAL DATA:  Abdominal pain, fever, right upper quadrant pain EXAM: CT ABDOMEN AND PELVIS WITH CONTRAST TECHNIQUE: Multidetector CT imaging of the abdomen and pelvis was performed using the standard protocol following bolus administration of intravenous contrast. CONTRAST:  OMNIPAQUE IOHEXOL 300 MG/ML  SOLN COMPARISON:  None. FINDINGS: Lower chest: Lung bases are clear. No effusions. Heart is normal size. Hepatobiliary: No focal hepatic abnormality. Gallbladder unremarkable. Pancreas: No focal abnormality or ductal dilatation. Spleen: No focal abnormality.  Normal size. Adrenals/Urinary Tract: No adrenal abnormality. No focal renal abnormality. No stones or hydronephrosis. Urinary bladder is unremarkable. Stomach/Bowel: Normal appendix. Stomach, large and small bowel grossly unremarkable. Vascular/Lymphatic: No evidence of aneurysm or adenopathy.  Reproductive: Uterus and adnexa unremarkable.  No mass. Other: Trace free fluid in the pelvis.  No free air. Musculoskeletal: No acute bony abnormality. IMPRESSION: No acute findings in the abdomen or pelvis. Electronically Signed   By: Charlett Nose M.D.   On: 05/22/2020 20:08   US PELVIC COMPLETE W TRANSVAGINAL AND TORSION R/O  Result Date: 05/23/2020 CLINICAL DATA:  Pelvic pain. EXAM: TRANSABDOMINAL AND TRANSVAGINAL ULTRASOUND OF PELVIS DOPPLER ULTRASOUND OF OVARIES TECHNIQUE: Both transabdominal and transvaginal ultrasound examinations of the pelvis were performed. Transabdominal technique was performed for global imaging of the pelvis including uterus, ovaries, adnexal regions, and pelvic cul-de-sac. It was necessary to proceed with endovaginal exam following the transabdominal exam to visualize the uterus, endometrium, bilateral ovaries and bilateral adnexa. Color and duplex Doppler ultrasound was utilized to evaluate blood flow to the ovaries. COMPARISON:  None. FINDINGS: Uterus Measurements: 7.8 cm x 4.0 cm x 4.7 cm = volume: 76.8 mL. No fibroids or other mass visualized. Endometrium Thickness: 1.3 cm.  No focal abnormality visualized. Right ovary Measurements: 3.5 cm x 1.6 cm x 2.1 cm = volume: 6.21 mL. Normal appearance/no adnexal mass. Left ovary Measurements: 3.1 cm x 1.3 cm x 1.7 cm = volume: 3.6 mL. Normal appearance/no adnexal mass. Pulsed Doppler evaluation of both ovaries demonstrates normal low-resistance arterial and venous waveforms. Other findings A small amount of pelvic free fluid is seen. IMPRESSION: Small amount of pelvic free fluid which may be physiologic in nature. Electronically Signed   By: Aram Candela M.D.   On: 05/23/2020 00:29   [place] negative for acute findings.  HR has trended down nicely, in the 90's on repeat exam.  She is non-toxic in appearance and pain has been controlled.  May be urinary source of infection causing pain/fever which I have discussed with her.  Will  start course of keflex, first dose given now.  covid screen is still pending, she will be contacted if positive.  Will follow-up with PCP.  Return here for any new/acute changes.    Garlon Hatchet, PA-C 05/23/20 0251    Dione Booze, MD 05/23/20 [id number]

## 2020-05-23 NOTE — Discharge Instructions (Addendum)
CT and ultrasound today were negative.  You may have developing UTI causing pain/fever.  Covid test is pending, you will be contacted in the morning if this is positive and will update into mychart as well. Take the prescribed antibiotics as directed-- sent to pharmacy for you.  Pyridium can help with pain, it will turn your urine bright orange/red in color which is normal.  Can continue tylenol or motrin as needed for fever. Follow-up with your primary care doctor. Return here for any new/acute changes.

## 2020-05-24 LAB — GC/CHLAMYDIA PROBE AMP (~~LOC~~) NOT AT ARMC
Chlamydia: NEGATIVE
Comment: NEGATIVE
Comment: NORMAL
Neisseria Gonorrhea: NEGATIVE

## 2020-05-25 ENCOUNTER — Emergency Department (HOSPITAL_COMMUNITY)
Admission: EM | Admit: 2020-05-25 | Discharge: 2020-05-25 | Disposition: A | Discharge status: Home / Self Care | Payer: Medicaid Other | Attending: Emergency Medicine | Admitting: Emergency Medicine

## 2020-05-25 ENCOUNTER — Encounter (HOSPITAL_COMMUNITY): Payer: Self-pay

## 2020-05-25 DIAGNOSIS — M549 Dorsalgia, unspecified: Secondary | ICD-10-CM | POA: Diagnosis not present

## 2020-05-25 DIAGNOSIS — R1110 Vomiting, unspecified: Secondary | ICD-10-CM | POA: Diagnosis not present

## 2020-05-25 DIAGNOSIS — K219 Gastro-esophageal reflux disease without esophagitis: Secondary | ICD-10-CM | POA: Insufficient documentation

## 2020-05-25 DIAGNOSIS — R109 Unspecified abdominal pain: Secondary | ICD-10-CM | POA: Insufficient documentation

## 2020-05-25 DIAGNOSIS — Z7722 Contact with and (suspected) exposure to environmental tobacco smoke (acute) (chronic): Secondary | ICD-10-CM | POA: Diagnosis not present

## 2020-05-25 LAB — CBC WITH DIFFERENTIAL/PLATELET
Abs Immature Granulocytes: 0.01 10*3/uL (ref 0.00–0.07)
Basophils Absolute: 0.1 10*3/uL (ref 0.0–0.1)
Basophils Relative: 1 %
Eosinophils Absolute: 0.1 10*3/uL (ref 0.0–0.5)
Eosinophils Relative: 2 %
HCT: 39.6 % (ref 36.0–46.0)
Hemoglobin: 12.7 g/dL (ref 12.0–15.0)
Immature Granulocytes: 0 %
Lymphocytes Relative: 54 %
Lymphs Abs: 3.3 10*3/uL (ref 0.7–4.0)
MCH: 27.9 pg (ref 26.0–34.0)
MCHC: 32.1 g/dL (ref 30.0–36.0)
MCV: 86.8 fL (ref 80.0–100.0)
Monocytes Absolute: 0.6 10*3/uL (ref 0.1–1.0)
Monocytes Relative: 9 %
Neutro Abs: 2.1 10*3/uL (ref 1.7–7.7)
Neutrophils Relative %: 34 %
Platelets: 412 10*3/uL — ABNORMAL HIGH (ref 150–400)
RBC: 4.56 MIL/uL (ref 3.87–5.11)
RDW: 13.2 % (ref 11.5–15.5)
WBC: 6.1 10*3/uL (ref 4.0–10.5)
nRBC: 0 % (ref 0.0–0.2)

## 2020-05-25 LAB — COMPREHENSIVE METABOLIC PANEL
ALT: 22 U/L (ref 0–44)
AST: 20 U/L (ref 15–41)
Albumin: 4 g/dL (ref 3.5–5.0)
Alkaline Phosphatase: 81 U/L (ref 38–126)
Anion gap: 11 (ref 5–15)
BUN: 7 mg/dL (ref 6–20)
CO2: 26 mmol/L (ref 22–32)
Calcium: 9.5 mg/dL (ref 8.9–10.3)
Chloride: 103 mmol/L (ref 98–111)
Creatinine, Ser: 0.7 mg/dL (ref 0.44–1.00)
GFR, Estimated: >60 mL/min (ref >60)
Glucose, Bld: 89 mg/dL (ref 70–99)
Potassium: 4.1 mmol/L (ref 3.5–5.1)
Sodium: 140 mmol/L (ref 135–145)
Total Bilirubin: 0.5 mg/dL (ref 0.3–1.2)
Total Protein: 8 g/dL (ref 6.5–8.1)

## 2020-05-25 LAB — URINALYSIS, ROUTINE W REFLEX MICROSCOPIC
Bilirubin Urine: NEGATIVE
Glucose, UA: NEGATIVE mg/dL
Ketones, ur: NEGATIVE mg/dL
Leukocytes,Ua: NEGATIVE
Nitrite: POSITIVE — AB
Protein, ur: NEGATIVE mg/dL
RBC / HPF: >50 RBC/hpf — ABNORMAL HIGH (ref 0–5)
Specific Gravity, Urine: 1.012 (ref 1.005–1.030)
pH: 6 (ref 5.0–8.0)

## 2020-05-25 LAB — I-STAT BETA HCG BLOOD, ED (MC, WL, AP ONLY): I-stat hCG, quantitative: <5 m[IU]/mL (ref <5)

## 2020-05-25 LAB — LIPASE, BLOOD: Lipase: 22 U/L (ref 11–51)

## 2020-05-25 MED ORDER — NAPROXEN 375 MG PO TABS: 375.0000 mg | ORAL_TABLET | Freq: Two times a day (BID) | ORAL | 0 refills | Status: DC

## 2020-05-25 MED ORDER — DICYCLOMINE HCL 20 MG PO TABS: 20.0000 mg | ORAL_TABLET | Freq: Two times a day (BID) | ORAL | 0 refills | Status: DC

## 2020-05-25 MED ORDER — KETOROLAC TROMETHAMINE 30 MG/ML IJ SOLN
30.0000 mg | Freq: Once | INTRAMUSCULAR | Status: AC
  Administered 2020-05-25: 30 mg via INTRAVENOUS
  Filled 2020-05-25: qty 1

## 2020-05-25 MED ORDER — SODIUM CHLORIDE 0.9 % IV BOLUS
1000.0000 mL | Freq: Once | INTRAVENOUS | Status: AC
  Administered 2020-05-25: 1000 mL via INTRAVENOUS

## 2020-05-25 MED ORDER — ONDANSETRON HCL 4 MG/2ML IJ SOLN
4.0000 mg | Freq: Once | INTRAMUSCULAR | Status: AC
  Administered 2020-05-25: 4 mg via INTRAVENOUS
  Filled 2020-05-25: qty 2

## 2020-05-25 MED ORDER — SODIUM CHLORIDE 0.9 % IV SOLN: INTRAVENOUS | Status: DC

## 2020-05-25 NOTE — Discharge Instructions (Signed)
Take the medications as prescribed to help with your pain.  Follow-up with your primary care doctor or consider seeing a GI doctor for further evaluation.

## 2020-05-25 NOTE — ED Triage Notes (Signed)
Pt c/o RLQ abdominal pain, back pain on her right side, and right sided neck pain. Pt states she came in Saturday and was given a prescription for pain but it has not helped.

## 2020-05-25 NOTE — ED Provider Notes (Signed)
[facility]-EMERGENCY DEPT Provider Note   CSN: [id number] Arrival date & time: 05/25/20  1258     History Chief Complaint  Patient presents with  . Abdominal Pain  . Back Pain    APARNA Lawrence is Jamie 19 y.o. female.  HPI   Pt presents to the ED with persistent abd pain.  She was seen in the ED on the 30th.  She had lab tests as well as CT scans and [place].  Pt was discharged and states the pain is not getting better.  It feels worse.  The pain is all over her abd and goes to her back and sides.  She did vomit this am.  No urinary sx.  No vaginal discharge.  Past Medical History:  Diagnosis Date  . Anxiety   . Bipolar depression (HCC)   . Chlamydia infection affecting pregnancy 07/06/2017   tx on 07/04/17  . Depression   . Eating disorder   . GERD (gastroesophageal reflux disease)   . Hypertension in pregnancy, preeclampsia, severe, delivered 07/05/2017  . Hypomania (HCC)   . IBS (irritable bowel syndrome)   . Insomnia   . Migraines   . Miscarriage March 2016  . Mononucleosis   . Mood disturbance   . 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 Vomiting     Patient Active Problem List   Diagnosis Date Noted  . Lower urinary tract infectious disease 07/29/2019  . Chlamydia infection 08/23/2017  . Depression   . Panic attacks 07/07/2017  . 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 GAD (generalized anxiety disorder) 12/08/2014  . Bulimia nervosa 12/08/2014    Past Surgical History:  Procedure Laterality Date  . COLONOSCOPY    . ESOPHAGOGASTRODUODENOSCOPY ENDOSCOPY       OB History    Gravida  2   Para  1   Term  0   Preterm  1   AB  1   Living  1     SAB  1   TAB  0   Ectopic  0   Multiple  0   Live Births  1           Family History  Problem Relation Age of Onset  . Hypertension Mother   . Hypertension Father   . Mental illness Father   . Hypertension  Maternal Grandmother   . Hypertension Paternal Grandmother   . Hypertension Paternal Grandfather   . Diabetes Paternal Grandfather   . Brain cancer Maternal Aunt   . Colon cancer Maternal Aunt     Social History   Tobacco Use  . Smoking status: Passive Smoke Exposure - Never Smoker  . Smokeless tobacco: Never Used  Vaping Use  . Vaping Use: Never used  Substance Use Topics  . Alcohol use: No    Alcohol/week: 0.0 standard drinks  . Drug use: No    Home Medications Prior to Admission medications   Medication Sig Start Date End Date Taking? Authorizing Provider  cephALEXin (KEFLEX) 500 MG capsule Take 1 capsule (500 mg total) by mouth 3 (three) times daily. 05/23/20   Garlon Hatchet, PA-C  dicyclomine (BENTYL) 20 MG tablet Take 1 tablet (20 mg total) by mouth 2 (two) times daily. 05/25/20   Linwood Dibbles, MD  fluticasone (FLONASE) 50 MCG/ACT nasal spray Place 1 spray into both nostrils daily. 03/28/20   Dahlia Byes A, NP  ibuprofen (ADVIL) 600 MG tablet Take 1 tablet (600  mg total) by mouth every 8 (eight) hours as needed for moderate pain. 03/28/20   Dahlia Byes A, NP  naproxen (NAPROSYN) 375 MG tablet Take 1 tablet (375 mg total) by mouth 2 (two) times daily. 05/25/20   Linwood Dibbles, MD  ondansetron (ZOFRAN) 4 MG tablet Take 4 mg by mouth every 8 (eight) hours as needed for nausea or vomiting.  03/31/20   [provider]  phenazopyridine (PYRIDIUM) 200 MG tablet Take 1 tablet (200 mg total) by mouth 3 (three) times daily as needed for pain. 05/23/20   Garlon Hatchet, PA-C    Allergies    Patient has no known allergies.  Review of Systems   Review of Systems  All other systems reviewed and are negative.   Physical Exam Updated Vital Signs BP 120/87   Pulse 80   Temp 98.9 F (37.2 C) (Oral)   Resp 16   SpO2 99%   Physical Exam Vitals and nursing note reviewed.  Constitutional:      General: She is not in acute distress.    Appearance: She is well-developed.  HENT:      Head: Normocephalic and atraumatic.     Right Ear: External ear normal.     Left Ear: External ear normal.  Eyes:     General: No scleral icterus.       Right eye: No discharge.        Left eye: No discharge.     Conjunctiva/sclera: Conjunctivae normal.  Neck:     Trachea: No tracheal deviation.  Cardiovascular:     Rate and Rhythm: Normal rate and regular rhythm.  Pulmonary:     Effort: Pulmonary effort is normal. No respiratory distress.     Breath sounds: Normal breath sounds. No stridor. No wheezing or rales.  Abdominal:     General: Bowel sounds are normal. There is no distension.     Palpations: Abdomen is soft.     Tenderness: There is generalized abdominal tenderness. There is no guarding or rebound.  Musculoskeletal:        General: No tenderness.     Cervical back: Neck supple.  Skin:    General: Skin is warm and dry.     Findings: No rash.  Neurological:     Mental Status: She is alert.     Cranial Nerves: No cranial nerve deficit (no facial droop, extraocular movements intact, no slurred speech).     Sensory: No sensory deficit.     Motor: No abnormal muscle tone or seizure activity.     Coordination: Coordination normal.     ED Results / Procedures / Treatments   Labs (all labs ordered are listed, but only abnormal results are displayed) Labs Reviewed  CBC WITH DIFFERENTIAL/PLATELET - Abnormal; Notable for the following components:      Result Value   Platelets 412 (*)    All other components within normal limits  URINALYSIS, ROUTINE W REFLEX MICROSCOPIC - Abnormal; Notable for the following components:   Color, Urine AMBER (*)    Hgb urine dipstick MODERATE (*)    Nitrite POSITIVE (*)    RBC / HPF >50 (*)    Bacteria, UA RARE (*)    All other components within normal limits  COMPREHENSIVE METABOLIC PANEL  LIPASE, BLOOD  I-STAT BETA HCG BLOOD, ED ([facility], [facility], [facility] ONLY)    EKG None  Radiology No results found.  Procedures Procedures (including  critical care time)  Medications Ordered in ED Medications  sodium chloride 0.9 % bolus 1,000 mL (1,000 mLs Intravenous New Bag/Given (Non-Interop) 05/25/20 1426)    And  0.9 %  sodium chloride infusion ( Intravenous New Bag/Given 05/25/20 1427)  ondansetron (ZOFRAN) injection 4 mg (4 mg Intravenous Given 05/25/20 1417)  ketorolac (TORADOL) 30 MG/ML injection 30 mg (30 mg Intravenous Given 05/25/20 1418)    ED Course  I have reviewed the triage vital signs and the nursing notes.  Pertinent labs & imaging results that were available during my care of the patient were reviewed by me and considered in my medical decision making (see chart for details).    MDM Rules/Calculators/Jamie&P                          Patient presented to the ED for evaluation of persistent abdominal pain. Patient's work-up yesterday was reviewed. Patient had CT scans as well as laboratory tests and ultrasound. Laboratory tests today are unremarkable with the exception hematuria. Prior CT did not show any kidney stone. No evidence of acute abnormality noted on her scans. Patient mention she has had some spotting. Patient was discharged home on antibiotics and Pyridium. Think she would benefit from an NSAID and I will give her also Bentyl as she does have history of IBS and its possible that could be causing some of her symptoms. Final Clinical Impression(s) / ED Diagnoses Final diagnoses:  Abdominal pain, unspecified abdominal location    Rx / DC Orders ED Discharge Orders         Ordered    dicyclomine (BENTYL) 20 MG tablet  2 times daily        05/25/20 1627    naproxen (NAPROSYN) 375 MG tablet  2 times daily        05/25/20 1627           Linwood Dibbles, MD 05/25/20 1639

## 2020-05-31 ENCOUNTER — Encounter: Payer: Self-pay | Admitting: Physician Assistant

## 2020-06-14 ENCOUNTER — Ambulatory Visit (INDEPENDENT_AMBULATORY_CARE_PROVIDER_SITE_OTHER): Payer: Medicaid Other | Admitting: Physician Assistant

## 2020-06-14 ENCOUNTER — Encounter: Payer: Self-pay | Admitting: Physician Assistant

## 2020-06-14 VITALS — BP 120/88 | HR 96 | Ht 59.0 in | Wt 146.0 lb

## 2020-06-14 DIAGNOSIS — R109 Unspecified abdominal pain: Secondary | ICD-10-CM | POA: Diagnosis not present

## 2020-06-14 DIAGNOSIS — K219 Gastro-esophageal reflux disease without esophagitis: Secondary | ICD-10-CM | POA: Diagnosis not present

## 2020-06-14 MED ORDER — OMEPRAZOLE 20 MG PO CPDR: 20.0000 mg | DELAYED_RELEASE_CAPSULE | Freq: Every day | ORAL | 3 refills | Status: DC

## 2020-06-14 NOTE — Patient Instructions (Signed)
If you are age 20 or older, your body mass index should be between 23-30. Your Body mass index is 29.49 kg/m. If this is out of the aforementioned range listed, please consider follow up with your Primary Care Provider.  If you are age 81 or younger, your body mass index should be between 19-25. Your Body mass index is 29.49 kg/m. If this is out of the aformentioned range listed, please consider follow up with your Primary Care Provider.   We have sent the following medications to your pharmacy for you to pick up at your convenience: Omeprazole 20 mg daily 30-60 minutes before breakfast.   Follow up as needed.

## 2020-06-14 NOTE — Progress Notes (Signed)
I agree with the above note, plan 

## 2020-06-14 NOTE — Progress Notes (Signed)
Chief Complaint: Follow-up after ER visit for abdominal pain  HPI:    Jamie Lawrence is a 19 year old African-American female with past medical history as listed below including bipolar depression, IBS and others, who presents to clinic today after being seen in the ED twice for abdominal pain.    05/22/2020 ER visit for abdominal pain.  CT the abdomen pelvis with contrast showed no acute findings in the abdomen or pelvis.  Transvaginal ultrasound with a small amount of pelvic free fluid which may be physiologic in nature.    05/25/2020 patient seen back in the ED after being seen on 10/30 for persistent abdominal pain.  Initially had CT scan and ultrasound was discharged with the pain did not get better.  Labs at that time showed a normal CBC, CMP, lipase.  At previous visit patient had been discharged home on antibiotics and Pyridium for suspected UTI.  She was started on an NSAID and some Bentyl.    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 Tells me this pain got worse after the pain meds they gave her in the ER and specifically worse after they gave her contrast for the CT. Nothing they gave her helped. She went home and continued with pain for the next 3 days and then represented to the ER as above. They gave her Bentyl but patient tells that nothing really helped. She just continued to live through the pain and it gradually went away, now it is mostly better. Describes that she was not doing anything different physically during those times, no back pain, no change in diet, no new medications, no diarrhea or change in bowel habits. Describes a history of IBS diagnosed 6 years ago but has never taken medication for it.    Does tell me she has daily reflux symptoms and  thinks she was may be on some medicine a long time ago. Currently not taking anything.    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    Denies fever, chills, blood in her stool or symptoms that awaken her from sleep.  Past Medical History:  Diagnosis Date  . Anxiety   . Bipolar depression (HCC)   . Chlamydia infection affecting pregnancy 07/06/2017   tx on 07/04/17  . Depression   . Eating disorder   . GERD (gastroesophageal reflux disease)   . Hypertension in pregnancy, preeclampsia, severe, delivered 07/05/2017  . Hypomania (HCC)   . IBS (irritable bowel syndrome)   . Insomnia   . Migraines   . Miscarriage March 2016  . Mononucleosis   . Mood disturbance   . 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 Vomiting     Past Surgical History:  Procedure Laterality Date  . COLONOSCOPY    . ESOPHAGOGASTRODUODENOSCOPY ENDOSCOPY      Current Outpatient Medications  Medication Sig Dispense Refill  . fluticasone (FLONASE) 50 MCG/ACT nasal spray Place 1 spray into both nostrils daily. 16 g 2  . ibuprofen (ADVIL) 600 MG tablet Take 1 tablet (600 mg total) by mouth every 8 (eight) hours as needed for moderate pain. 30 tablet 0  . ondansetron (ZOFRAN) 4 MG tablet Take 4 mg by mouth every 8 (eight) hours as needed for nausea or vomiting.  (  Patient not taking: Reported on 06/14/2020)     No current facility-administered medications for this visit.    Allergies as of 06/14/2020  . (No Known Allergies)    Family History  Problem Relation Age of Onset  . Hypertension Mother   . Hypertension Father   . Mental illness Father   . Hypertension Maternal Grandmother   . Hypertension Paternal Grandmother   . Hypertension Paternal Grandfather   . Diabetes Paternal Grandfather   . Brain cancer Maternal Aunt   . Colon cancer Maternal Aunt   . Pancreatic cancer Neg Hx   . Esophageal cancer Neg Hx     Social History   Socioeconomic History  . Marital status: Single    Spouse name: Not on  file  . Number of children: Not on file  . Years of education: Not on file  . Highest education level: Not on file  Occupational History  . Occupation: Consulting civil engineer    Comment: Engineer, site  Tobacco Use  . Smoking status: Passive Smoke Exposure - Never Smoker  . Smokeless tobacco: Never Used  Vaping Use  . Vaping Use: Never used  Substance and Sexual Activity  . Alcohol use: No    Alcohol/week: 0.0 standard drinks  . Drug use: No  . Sexual activity: Not Currently    Partners: Male    Birth control/protection: None  Other Topics Concern  . Not on file  Social History Narrative  . Not on file   Social Determinants of Health   Financial Resource Strain:   . Difficulty of Paying Living Expenses: Not on file  Food Insecurity:   . Worried About Programme researcher, broadcasting/film/video in the Last Year: Not on file  . Ran Out of Food in the Last Year: Not on file  Transportation Needs:   . Lack of Transportation (Medical): Not on file  . Lack of Transportation (Non-Medical): Not on file  Physical Activity:   . Days of Exercise per Week: Not on file  . Minutes of Exercise per Session: Not on file  Stress:   . Feeling of Stress : Not on file  Social Connections:   . Frequency of Communication with Friends and Family: Not on file  . Frequency of Social Gatherings with Friends and Family: Not on file  . Attends Religious Services: Not on file  . Active Member of Clubs or Organizations: Not on file  . Attends Banker Meetings: Not on file  . Marital Status: Not on file  Intimate Partner Violence:   . Fear of Current or Ex-Partner: Not on file  . Emotionally Abused: Not on file  . Physically Abused: Not on file  . Sexually Abused: Not on file    Review of Systems:    Constitutional: No weight loss, fever or chills Skin: No rash  Cardiovascular: No chest pain Respiratory: No SOB  Gastrointestinal: See HPI and otherwise negative Genitourinary: No dysuria  Neurological: No  headache, dizziness or syncope Musculoskeletal: No new muscle or joint pain Hematologic: No bleeding Psychiatric: +bipolar   Physical Exam:  Vital signs: BP 120/88   Pulse 96   Ht 4\' 11"  (1.499 m)   Wt 146 lb (66.2 kg)   LMP 05/23/2020   SpO2 99%   BMI 29.49 kg/m   Constitutional:   Pleasant AA female appears to be in NAD, Well developed, Well nourished, alert and cooperative Head:  Normocephalic and atraumatic. Eyes:   PEERL, EOMI. No icterus. Conjunctiva pink. Ears:  Normal auditory acuity. Neck:  Supple Throat: Oral cavity and pharynx without inflammation, swelling or lesion.  Respiratory: Respirations even and unlabored. Lungs clear to auscultation bilaterally.   No wheezes, crackles, or rhonchi.  Cardiovascular: Normal S1, S2. No MRG. Regular rate and rhythm. No peripheral edema, cyanosis or pallor.  Gastrointestinal:  Soft, nondistended, nontender. No rebound or guarding. Normal bowel sounds. No appreciable masses or hepatomegaly. Rectal:  Not performed.  Msk:  Symmetrical without gross deformities. Without edema, no deformity or joint abnormality.  Neurologic:  Alert and  oriented x4;  grossly normal neurologically.  Skin:   Dry and intact without significant lesions or rashes. Psychiatric:Demonstrates good judgement and reason without abnormal affect or behaviors.  RELEVANT LABS AND IMAGING: CBC    Component Value Date/Time   WBC 6.1 05/25/2020 1445   RBC 4.56 05/25/2020 1445   HGB 12.7 05/25/2020 1445   HGB 10.9 (L) 06/11/2017 1412   HCT 39.6 05/25/2020 1445   HCT 32.0 (L) 06/11/2017 1412   PLT 412 (H) 05/25/2020 1445   PLT 343 06/11/2017 1412   MCV 86.8 05/25/2020 1445   MCV 84 06/11/2017 1412   MCH 27.9 05/25/2020 1445   MCHC 32.1 05/25/2020 1445   RDW 13.2 05/25/2020 1445   RDW 13.8 06/11/2017 1412   LYMPHSABS 3.3 05/25/2020 1445   LYMPHSABS 2.7 01/31/2017 1655   MONOABS 0.6 05/25/2020 1445   EOSABS 0.1 05/25/2020 1445   EOSABS 0.2 01/31/2017 1655    BASOSABS 0.1 05/25/2020 1445   BASOSABS 0.0 01/31/2017 1655    CMP     Component Value Date/Time   NA 140 05/25/2020 1445   NA 135 01/31/2017 1655   K 4.1 05/25/2020 1445   CL 103 05/25/2020 1445   CO2 26 05/25/2020 1445   GLUCOSE 89 05/25/2020 1445   BUN 7 05/25/2020 1445   BUN 7 01/31/2017 1655   CREATININE 0.70 05/25/2020 1445   CALCIUM 9.5 05/25/2020 1445   PROT 8.0 05/25/2020 1445   PROT 7.3 01/31/2017 1655   ALBUMIN 4.0 05/25/2020 1445   ALBUMIN 4.2 01/31/2017 1655   AST 20 05/25/2020 1445   ALT 22 05/25/2020 1445   ALKPHOS 81 05/25/2020 1445   BILITOT 0.5 05/25/2020 1445   BILITOT <0.2 01/31/2017 1655   GFRNONAA >60 05/25/2020 1445   GFRAA >60 01/31/2019 0609    Assessment: 1. GERD: Daily per the patient, vague history of an EGD 6 or 7 years ago, we do not have results 2. Abdominal pain: Acute episode of mostly right-sided abdominal pain, proceeded to the ER with unrevealing work-up including CT and labs, better now; uncertain etiology  Plan: 1. Discussed with patient that I am not sure what happened when she had her severe abdominal pain, certainly imaging was unrevealing and labs did not show anything other than suspected UTI. Discussed that it could have been related to an abdominal spasm but I would have thought that the Bentyl would have helped. I am happy she is not having pain now but recommend that she call us if this occurs again. 2. Started the patient on Omeprazole 20 mg daily, 30-60 minutes before breakfast for her reflux symptoms. #30 with 3 refills. 3. Patient was assigned to Dr. Christella Hartigan this morning. She will follow in clinic with [place] as needed in the future.  Hyacinth Meeker, PA-C [facility] 06/14/2020, 9:17 AM

## 2020-07-11 ENCOUNTER — Encounter (HOSPITAL_COMMUNITY): Payer: Self-pay | Admitting: Emergency Medicine

## 2020-07-11 ENCOUNTER — Other Ambulatory Visit: Payer: Self-pay

## 2020-07-11 ENCOUNTER — Emergency Department (HOSPITAL_COMMUNITY)
Admission: EM | Admit: 2020-07-11 | Discharge: 2020-07-11 | Disposition: A | Discharge status: Home / Self Care | Payer: Medicaid Other | Attending: Emergency Medicine | Admitting: Emergency Medicine

## 2020-07-11 DIAGNOSIS — Z5321 Procedure and treatment not carried out due to patient leaving prior to being seen by health care provider: Secondary | ICD-10-CM | POA: Insufficient documentation

## 2020-07-11 DIAGNOSIS — R079 Chest pain, unspecified: Secondary | ICD-10-CM | POA: Diagnosis not present

## 2020-07-11 DIAGNOSIS — R42 Dizziness and giddiness: Secondary | ICD-10-CM | POA: Insufficient documentation

## 2020-07-11 DIAGNOSIS — H538 Other visual disturbances: Secondary | ICD-10-CM | POA: Insufficient documentation

## 2020-07-11 DIAGNOSIS — R110 Nausea: Secondary | ICD-10-CM | POA: Insufficient documentation

## 2020-07-11 LAB — CBC
HCT: 38.5 % (ref 36.0–46.0)
Hemoglobin: 12.4 g/dL (ref 12.0–15.0)
MCH: 27.9 pg (ref 26.0–34.0)
MCHC: 32.2 g/dL (ref 30.0–36.0)
MCV: 86.5 fL (ref 80.0–100.0)
Platelets: 368 10*3/uL (ref 150–400)
RBC: 4.45 MIL/uL (ref 3.87–5.11)
RDW: 13.8 % (ref 11.5–15.5)
WBC: 8.9 10*3/uL (ref 4.0–10.5)
nRBC: 0 % (ref 0.0–0.2)

## 2020-07-11 LAB — I-STAT BETA HCG BLOOD, ED (MC, WL, AP ONLY): I-stat hCG, quantitative: <5 m[IU]/mL (ref <5)

## 2020-07-11 LAB — BASIC METABOLIC PANEL
Anion gap: 10 (ref 5–15)
BUN: 13 mg/dL (ref 6–20)
CO2: 23 mmol/L (ref 22–32)
Calcium: 9.1 mg/dL (ref 8.9–10.3)
Chloride: 104 mmol/L (ref 98–111)
Creatinine, Ser: 0.7 mg/dL (ref 0.44–1.00)
GFR, Estimated: >60 mL/min (ref >60)
Glucose, Bld: 89 mg/dL (ref 70–99)
Potassium: 4.4 mmol/L (ref 3.5–5.1)
Sodium: 137 mmol/L (ref 135–145)

## 2020-07-11 LAB — TROPONIN I (HIGH SENSITIVITY): Troponin I (High Sensitivity): <2 ng/L (ref <18)

## 2020-07-11 NOTE — ED Notes (Signed)
Pt had reported increase in chest pain. Had repeat EKG done, shown to md and VS reassessed. No acute distress is noted.

## 2020-07-11 NOTE — ED Triage Notes (Signed)
C/o pain to center of chest, lightheadedness, blurred vision, and nausea that started while at work at 1pm.  Denies SOB.

## 2020-07-11 NOTE — ED Notes (Signed)
Pt Left due to not being seen quick enough 

## 2020-07-12 ENCOUNTER — Encounter (HOSPITAL_COMMUNITY): Payer: Self-pay | Admitting: Emergency Medicine

## 2020-07-12 ENCOUNTER — Ambulatory Visit (HOSPITAL_COMMUNITY)
Admission: EM | Admit: 2020-07-12 | Discharge: 2020-07-12 | Disposition: A | Discharge status: Home / Self Care | Payer: Medicaid Other | Attending: Family Medicine | Admitting: Family Medicine

## 2020-07-12 ENCOUNTER — Ambulatory Visit (INDEPENDENT_AMBULATORY_CARE_PROVIDER_SITE_OTHER): Payer: Medicaid Other

## 2020-07-12 DIAGNOSIS — R42 Dizziness and giddiness: Secondary | ICD-10-CM | POA: Diagnosis not present

## 2020-07-12 DIAGNOSIS — R079 Chest pain, unspecified: Secondary | ICD-10-CM

## 2020-07-12 DIAGNOSIS — N3001 Acute cystitis with hematuria: Secondary | ICD-10-CM | POA: Diagnosis present

## 2020-07-12 DIAGNOSIS — R0789 Other chest pain: Secondary | ICD-10-CM | POA: Insufficient documentation

## 2020-07-12 DIAGNOSIS — Z3202 Encounter for pregnancy test, result negative: Secondary | ICD-10-CM | POA: Diagnosis not present

## 2020-07-12 DIAGNOSIS — H538 Other visual disturbances: Secondary | ICD-10-CM

## 2020-07-12 LAB — POCT URINALYSIS DIPSTICK, ED / UC
Bilirubin Urine: NEGATIVE
Glucose, UA: NEGATIVE mg/dL
Ketones, ur: NEGATIVE mg/dL
Nitrite: POSITIVE — AB
Protein, ur: NEGATIVE mg/dL
Specific Gravity, Urine: 1.025 (ref 1.005–1.030)
Urobilinogen, UA: 0.2 mg/dL (ref 0.0–1.0)
pH: 6.5 (ref 5.0–8.0)

## 2020-07-12 LAB — CBG MONITORING, ED: Glucose-Capillary: 85 mg/dL (ref 70–99)

## 2020-07-12 LAB — POC URINE PREG, ED: Preg Test, Ur: NEGATIVE

## 2020-07-12 MED ORDER — NITROFURANTOIN MONOHYD MACRO 100 MG PO CAPS: 100.0000 mg | ORAL_CAPSULE | Freq: Two times a day (BID) | ORAL | 0 refills | Status: DC

## 2020-07-12 NOTE — ED Triage Notes (Addendum)
Patient c/o centralized chest pain and blurred vision x 1 day.   Patient endorses a "slight headache".   Patient endorses dizziness and lightheadedness.   Patient endorses that inhalation and eating makes chest pain worst.   Patient states "blurry vision occurs sometimes when I get up and sometimes when I'm sitting for to long"/   Patient denies any fall or trauma.

## 2020-07-12 NOTE — ED Provider Notes (Signed)
[facility]    CSN: [id number] Arrival date & time: 07/12/20  1052      History   Chief Complaint Chief Complaint  Patient presents with  . Chest Pain  . Dizziness    HPI Jamie Lawrence is a 19 y.o. female.   Patient is a 19 year old female presents today with multiple complaints.  Reporting centralized chest pain, slight headache, slight dizziness and lightheadedness.  This is been present for the past day or so.  Denies any specific falls or trauma.  Chest pain is worse with deep breathing.  No cough, chest congestion or fevers.  Just overall has not felt well.  Denies any urinary symptoms to include dysuria, hematuria urinary frequency.  Abdominal pain, nausea, vomiting or diarrhea.  Patient visited the ED yesterday but left due to wait time     Past Medical History:  Diagnosis Date  . Anxiety   . Bipolar depression (HCC)   . Chlamydia infection affecting pregnancy 07/06/2017   tx on 07/04/17  . Depression   . Eating disorder   . GERD (gastroesophageal reflux disease)   . Hypertension in pregnancy, preeclampsia, severe, delivered 07/05/2017  . Hypomania (HCC)   . IBS (irritable bowel syndrome)   . Insomnia   . Migraines   . Miscarriage March 2016  . Mononucleosis   . Mood disturbance   . 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 Vomiting     Patient Active Problem List   Diagnosis Date Noted  . Lower urinary tract infectious disease 07/29/2019  . Chlamydia infection 08/23/2017  . Depression   . Panic attacks 07/07/2017  . 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 GAD (generalized anxiety disorder) 12/08/2014  . Bulimia nervosa 12/08/2014    Past Surgical History:  Procedure Laterality Date  . COLONOSCOPY    . ESOPHAGOGASTRODUODENOSCOPY ENDOSCOPY      OB History    Gravida  2   Para  1   Term  0   Preterm  1   AB  1   Living  1     SAB  1   IAB  0   Ectopic  0    Multiple  0   Live Births  1            Home Medications    Prior to Admission medications   Medication Sig Start Date End Date Taking? Authorizing Provider  fluticasone (FLONASE) 50 MCG/ACT nasal spray Place 1 spray into both nostrils daily. 03/28/20   Dahlia Byes A, NP  ibuprofen (ADVIL) 600 MG tablet Take 1 tablet (600 mg total) by mouth every 8 (eight) hours as needed for moderate pain. 03/28/20   Dahlia Byes A, NP  nitrofurantoin, macrocrystal-monohydrate, (MACROBID) 100 MG capsule Take 1 capsule (100 mg total) by mouth 2 (two) times daily. 07/12/20   Dahlia Byes A, NP  omeprazole (PRILOSEC) 20 MG capsule Take 1 capsule (20 mg total) by mouth daily. 06/14/20   Unk Lightning, PA  ondansetron (ZOFRAN) 4 MG tablet Take 4 mg by mouth every 8 (eight) hours as needed for nausea or vomiting.  Patient not taking: Reported on 06/14/2020 03/31/20   [provider]    Family History Family History  Problem Relation Age of Onset  . Hypertension Mother   . Hypertension Father   . Mental illness Father   . Hypertension Maternal Grandmother   . Hypertension Paternal Grandmother   . Hypertension Paternal Grandfather   .  Diabetes Paternal Grandfather   . Brain cancer Maternal Aunt   . Colon cancer Maternal Aunt   . Pancreatic cancer Neg Hx   . Esophageal cancer Neg Hx     Social History Social History   Tobacco Use  . Smoking status: Passive Smoke Exposure - Never Smoker  . Smokeless tobacco: Never Used  Vaping Use  . Vaping Use: Never used  Substance Use Topics  . Alcohol use: No    Alcohol/week: 0.0 standard drinks  . Drug use: No     Allergies   Patient has no known allergies.   Review of Systems Review of Systems   Physical Exam Triage Vital Signs ED Triage Vitals  Enc Vitals Group     BP 07/12/20 1110 131/77     Pulse Rate 07/12/20 1110 84     Resp 07/12/20 1110 15     Temp 07/12/20 1110 98.6 F (37 C)     Temp Source 07/12/20 1110 Oral      SpO2 07/12/20 1110 100 %     Weight 07/12/20 1107 146 lb (66.2 kg)     Height 07/12/20 1107 4\' 11"  (1.499 m)     Head Circumference --      Peak Flow --      Pain Score 07/12/20 1107 8     Pain Loc --      Pain Edu? --      Excl. in GC? --    No data found.  Updated Vital Signs BP 131/77 (BP Location: Right Arm)   Pulse 84   Temp 98.6 F (37 C) (Oral)   Resp 15   Ht 4\' 11"  (1.499 m)   Wt 146 lb (66.2 kg)   LMP 06/16/2020   SpO2 100%   BMI 29.49 kg/m   Visual Acuity Right Eye Distance:   Left Eye Distance:   Bilateral Distance:    Right Eye Near:   Left Eye Near:    Bilateral Near:     Physical Exam Vitals and nursing note reviewed.  Constitutional:      General: She is not in acute distress.    Appearance: Normal appearance. She is not ill-appearing, toxic-appearing or diaphoretic.  HENT:     Head: Normocephalic.     Nose: Nose normal.     Mouth/Throat:     Pharynx: Oropharynx is clear.  Eyes:     Conjunctiva/sclera: Conjunctivae normal.  Cardiovascular:     Rate and Rhythm: Normal rate and regular rhythm.  Pulmonary:     Effort: Pulmonary effort is normal.     Breath sounds: Normal breath sounds.  Musculoskeletal:        General: Normal range of motion.     Cervical back: Normal range of motion.  Skin:    General: Skin is warm and dry.     Findings: No rash.  Neurological:     General: No focal deficit present.     Mental Status: She is alert.  Psychiatric:        Mood and Affect: Mood normal.      UC Treatments / Results  Labs (all labs ordered are listed, but only abnormal results are displayed) Labs Reviewed  POCT URINALYSIS DIPSTICK, ED / UC - Abnormal; Notable for the following components:      Result Value   Hgb urine dipstick SMALL (*)    Nitrite POSITIVE (*)    Leukocytes,Ua TRACE (*)    All other components within normal limits  URINE  CULTURE  POC URINE PREG, ED  CBG MONITORING, ED    EKG   Radiology DG Chest 2  View  Result Date: 07/12/2020 CLINICAL DATA:  Chest pain and blurry vision for 1 day. EXAM: CHEST - 2 VIEW COMPARISON:  PA and lateral chest 05/04/2007. FINDINGS: Lungs clear. Heart size normal. No pneumothorax or pleural fluid. No bony abnormality. IMPRESSION: Normal chest. Electronically Signed   By: Drusilla Kanner M.D.   On: 07/12/2020 12:16    Procedures Procedures (including critical care time)  Medications Ordered in UC Medications - No data to display  Initial Impression / Assessment and Plan / UC Course  I have reviewed the triage vital signs and the nursing notes.  Pertinent labs & imaging results that were available during my care of the patient were reviewed by me and considered in my medical decision making (see chart for details).     Chest wall pain Most likely musculoskeletal.  EKG with normal sinus rhythm and normal rate today.  Chest x-ray normal. Patient had blood work done in the ER yesterday that was unremarkable. Ibuprofen 600 mg every 8 hours for pain as needed  Urinary tract infection. Urine with trace leuks, small hemoglobin and positive nitrates.  Sending for culture. Believe this could be the cause of some her symptoms.  She may be mildly dehydrated. Treating with Macrobid.  Recommended push fluids.  Follow up as needed for continued or worsening symptoms  Final Clinical Impressions(s) / UC Diagnoses   Final diagnoses:  Chest wall pain  Acute cystitis with hematuria     Discharge Instructions     Your chest x-ray and EKG were normal. Blood work from the ER yesterday was normal You do have a urinary tract infection.  We are treating you for this. Make sure you are drinking plenty of fluids and staying hydrated You can do 600 of ibuprofen every 8 hours for pain Follow up as needed for continued or worsening symptoms     ED Prescriptions    Medication Sig Dispense Auth. Provider   nitrofurantoin, macrocrystal-monohydrate, (MACROBID) 100 MG  capsule Take 1 capsule (100 mg total) by mouth 2 (two) times daily. 10 capsule Dahlia Byes A, NP     PDMP not reviewed this encounter.   Janace Aris, NP 07/12/20 [phone]

## 2020-07-12 NOTE — Discharge Instructions (Addendum)
Your chest x-ray and EKG were normal. Blood work from the ER yesterday was normal You do have a urinary tract infection.  We are treating you for this. Make sure you are drinking plenty of fluids and staying hydrated You can do 600 of ibuprofen every 8 hours for pain Follow up as needed for continued or worsening symptoms

## 2020-07-13 ENCOUNTER — Emergency Department (HOSPITAL_COMMUNITY): Payer: Medicaid Other

## 2020-07-13 ENCOUNTER — Encounter (HOSPITAL_COMMUNITY): Payer: Self-pay

## 2020-07-13 ENCOUNTER — Emergency Department (HOSPITAL_COMMUNITY)
Admission: EM | Admit: 2020-07-13 | Discharge: 2020-07-13 | Disposition: A | Discharge status: Home / Self Care | Payer: Medicaid Other | Attending: Emergency Medicine | Admitting: Emergency Medicine

## 2020-07-13 ENCOUNTER — Other Ambulatory Visit: Payer: Self-pay

## 2020-07-13 DIAGNOSIS — Z7722 Contact with and (suspected) exposure to environmental tobacco smoke (acute) (chronic): Secondary | ICD-10-CM | POA: Insufficient documentation

## 2020-07-13 DIAGNOSIS — R079 Chest pain, unspecified: Secondary | ICD-10-CM | POA: Diagnosis present

## 2020-07-13 DIAGNOSIS — K219 Gastro-esophageal reflux disease without esophagitis: Secondary | ICD-10-CM

## 2020-07-13 DIAGNOSIS — Z8589 Personal history of malignant neoplasm of other organs and systems: Secondary | ICD-10-CM | POA: Diagnosis not present

## 2020-07-13 DIAGNOSIS — F419 Anxiety disorder, unspecified: Secondary | ICD-10-CM | POA: Diagnosis not present

## 2020-07-13 DIAGNOSIS — I10 Essential (primary) hypertension: Secondary | ICD-10-CM | POA: Diagnosis not present

## 2020-07-13 DIAGNOSIS — R0789 Other chest pain: Secondary | ICD-10-CM

## 2020-07-13 LAB — CBC
HCT: 40 % (ref 36.0–46.0)
Hemoglobin: 13.4 g/dL (ref 12.0–15.0)
MCH: 28.5 pg (ref 26.0–34.0)
MCHC: 33.5 g/dL (ref 30.0–36.0)
MCV: 84.9 fL (ref 80.0–100.0)
Platelets: 371 10*3/uL (ref 150–400)
RBC: 4.71 MIL/uL (ref 3.87–5.11)
RDW: 13.7 % (ref 11.5–15.5)
WBC: 8.1 10*3/uL (ref 4.0–10.5)
nRBC: 0 % (ref 0.0–0.2)

## 2020-07-13 LAB — BASIC METABOLIC PANEL
Anion gap: 9 (ref 5–15)
BUN: 9 mg/dL (ref 6–20)
CO2: 24 mmol/L (ref 22–32)
Calcium: 9.5 mg/dL (ref 8.9–10.3)
Chloride: 103 mmol/L (ref 98–111)
Creatinine, Ser: 0.66 mg/dL (ref 0.44–1.00)
GFR, Estimated: >60 mL/min (ref >60)
Glucose, Bld: 94 mg/dL (ref 70–99)
Potassium: 4.2 mmol/L (ref 3.5–5.1)
Sodium: 136 mmol/L (ref 135–145)

## 2020-07-13 LAB — TROPONIN I (HIGH SENSITIVITY)
Troponin I (High Sensitivity): 2 ng/L (ref <18)
Troponin I (High Sensitivity): 2 ng/L (ref <18)

## 2020-07-13 LAB — I-STAT BETA HCG BLOOD, ED (MC, WL, AP ONLY): I-stat hCG, quantitative: <5 m[IU]/mL (ref <5)

## 2020-07-13 MED ORDER — ALUM & MAG HYDROXIDE-SIMETH 400-400-40 MG/5ML PO SUSP: 10.0000 mL | Freq: Four times a day (QID) | ORAL | 0 refills | Status: DC | PRN

## 2020-07-13 MED ORDER — ALUM & MAG HYDROXIDE-SIMETH 200-200-20 MG/5ML PO SUSP
30.0000 mL | Freq: Once | ORAL | Status: AC
  Administered 2020-07-13: 13:00:00 30 mL via ORAL
  Filled 2020-07-13: qty 30

## 2020-07-13 MED ORDER — FAMOTIDINE 20 MG PO TABS: 20.0000 mg | ORAL_TABLET | Freq: Every day | ORAL | 0 refills | Status: DC

## 2020-07-13 MED ORDER — LIDOCAINE VISCOUS HCL 2 % MT SOLN
15.0000 mL | Freq: Once | OROMUCOSAL | Status: AC
  Administered 2020-07-13: 13:00:00 15 mL via ORAL
  Filled 2020-07-13: qty 15

## 2020-07-13 NOTE — ED Triage Notes (Signed)
Patient c/o mid chest tightness and pain x 3 days. Patient states she went to [facility] 2 days ago, but left due to wait times and then went to an UC yesterday. Patient sated, "They did not really do nothing." Patient states she "feels light headed at times."

## 2020-07-13 NOTE — ED Notes (Signed)
Pt ambulatory to restroom

## 2020-07-13 NOTE — ED Provider Notes (Signed)
[facility]EMERGENCY DEPT Provider Note   CSN: [id number] Arrival date & time: 07/13/20  1110     History Chief Complaint  Patient presents with  . Chest Pain    Jamie Lawrence is a 19 y.o. female presenting for evaluation of chest pressure.  Patient states that the past few days, she has had chest pressure.  She has intermittent associated shortness of breath and lightheadedness, usually when the pain is severe.  When asked what makes her pain worse, she reports everything.  She reports nothing makes it better.  She has not taken anything including Tylenol ibuprofen.  She was seen at urgent care the other day, this is felt to be noncardiac and she was told to take NSAIDs.  She took 1 ibuprofen, states it did not help.  She denies fevers, chills, cough, nausea, vomiting, Donnell pain, urinary symptoms, normal bowel movements.  She does have a history of anxiety, does not take anything for it.  She has been feeling more anxious than normal.  She also reports a history of heartburn, states her symptoms have been worse recently, is not on any medicine.  She denies recent travel, surgeries, immobilization, history of cancer, history of previous DVT/PE, or hormone use.  She is currently on antibiotics for UTI, started 2 days ago.  Additional history taken chart reviewed.  Patient with a history of anxiety, bipolar, depression, GERD, hypertension, IBS  HPI     Past Medical History:  Diagnosis Date  . Anxiety   . Bipolar depression (HCC)   . Chlamydia infection affecting pregnancy 07/06/2017   tx on 07/04/17  . Depression   . Eating disorder   . GERD (gastroesophageal reflux disease)   . Hypertension in pregnancy, preeclampsia, severe, delivered 07/05/2017  . Hypomania (HCC)   . IBS (irritable bowel syndrome)   . Insomnia   . Migraines   . Miscarriage March 2016  . Mononucleosis   . Mood disturbance   . 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  Vomiting     Patient Active Problem List   Diagnosis Date Noted  . Lower urinary tract infectious disease 07/29/2019  . Chlamydia infection 08/23/2017  . Depression   . Panic attacks 07/07/2017  . 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 GAD (generalized anxiety disorder) 12/08/2014  . Bulimia nervosa 12/08/2014    Past Surgical History:  Procedure Laterality Date  . COLONOSCOPY    . ESOPHAGOGASTRODUODENOSCOPY ENDOSCOPY       OB History    Gravida  2   Para  1   Term  0   Preterm  1   AB  1   Living  1     SAB  1   IAB  0   Ectopic  0   Multiple  0   Live Births  1           Family History  Problem Relation Age of Onset  . Hypertension Mother   . Hypertension Father   . Mental illness Father   . Hypertension Maternal Grandmother   . Hypertension Paternal Grandmother   . Hypertension Paternal Grandfather   . Diabetes Paternal Grandfather   . Brain cancer Maternal Aunt   . Colon cancer Maternal Aunt   . Pancreatic cancer Neg Hx   . Esophageal cancer Neg Hx     Social History   Tobacco Use  . Smoking status: Passive Smoke Exposure - Never Smoker  .  Smokeless tobacco: Never Used  Vaping Use  . Vaping Use: Never used  Substance Use Topics  . Alcohol use: No    Alcohol/week: 0.0 standard drinks  . Drug use: No    Home Medications Prior to Admission medications   Medication Sig Start Date End Date Taking? Authorizing Provider  alum & mag hydroxide-simeth (MAALOX ADVANCED MAX ST) 400-400-40 MG/5ML suspension Take 10 mLs by mouth every 6 (six) hours as needed for indigestion. 07/13/20   Anurag Scarfo, PA-C  famotidine (PEPCID) 20 MG tablet Take 1 tablet (20 mg total) by mouth daily. 07/13/20   Soma Lizak, PA-C  fluticasone (FLONASE) 50 MCG/ACT nasal spray Place 1 spray into both nostrils daily. 03/28/20   Dahlia Byes A, NP  ibuprofen (ADVIL) 600 MG tablet Take 1 tablet (600 mg total)  by mouth every 8 (eight) hours as needed for moderate pain. 03/28/20   Dahlia Byes A, NP  nitrofurantoin, macrocrystal-monohydrate, (MACROBID) 100 MG capsule Take 1 capsule (100 mg total) by mouth 2 (two) times daily. 07/12/20   Dahlia Byes A, NP  omeprazole (PRILOSEC) 20 MG capsule Take 1 capsule (20 mg total) by mouth daily. 06/14/20   Unk Lightning, PA  ondansetron (ZOFRAN) 4 MG tablet Take 4 mg by mouth every 8 (eight) hours as needed for nausea or vomiting.  Patient not taking: Reported on 06/14/2020 03/31/20   [provider]    Allergies    Patient has no known allergies.  Review of Systems   Review of Systems  Respiratory: Shortness of breath: intermittent.   Cardiovascular: Positive for chest pain (chest pressure).  Psychiatric/Behavioral: The patient is nervous/anxious.   All other systems reviewed and are negative.   Physical Exam Updated Vital Signs BP 119/76   Pulse 71   Temp 98.4 F (36.9 C) (Oral)   Resp 18   Ht 4\' 11"  (1.499 m)   Wt 66.2 kg   LMP 06/16/2020   SpO2 99%   BMI 29.49 kg/m   Physical Exam Vitals and nursing note reviewed.  Constitutional:      General: She is not in acute distress.    Appearance: She is well-developed and well-nourished.     Comments: Resting comfortably in the bed in no acute distress  HENT:     Head: Normocephalic and atraumatic.  Eyes:     Extraocular Movements: EOM normal.     Conjunctiva/sclera: Conjunctivae normal.     Pupils: Pupils are equal, round, and reactive to light.  Cardiovascular:     Rate and Rhythm: Normal rate and regular rhythm.     Pulses: Normal pulses and intact distal pulses.  Pulmonary:     Effort: Pulmonary effort is normal. No respiratory distress.     Breath sounds: Normal breath sounds. No wheezing.  Abdominal:     General: There is no distension.     Palpations: Abdomen is soft. There is no mass.     Tenderness: There is no abdominal tenderness. There is no guarding or  rebound.  Musculoskeletal:        General: Normal range of motion.     Cervical back: Normal range of motion and neck supple.     Right lower leg: No edema.     Left lower leg: No edema.  Skin:    General: Skin is warm and dry.     Capillary Refill: Capillary refill takes less than 2 seconds.  Neurological:     Mental Status: She is alert and oriented to  person, place, and time.  Psychiatric:        Mood and Affect: Mood is anxious.     ED Results / Procedures / Treatments   Labs (all labs ordered are listed, but only abnormal results are displayed) Labs Reviewed  BASIC METABOLIC PANEL  CBC  I-STAT BETA HCG BLOOD, ED ([facility], [facility], [facility] ONLY)  TROPONIN I (HIGH SENSITIVITY)  TROPONIN I (HIGH SENSITIVITY)    EKG EKG Interpretation  Date/Time:  Tuesday July 13 2020 11:21:14 EST Ventricular Rate:  82 PR Interval:    QRS Duration: 80 QT Interval:  328 QTC Calculation: 383 R Axis:   86 Text Interpretation: Sinus rhythm 12 Lead; Mason-Likar when compared to prior, slower rate. NO STEMI Confirmed by Theda Belfastegeler, Chris ([id number]) on 07/13/2020 1:49:15 PM   Radiology DG Chest 2 View  Result Date: 07/13/2020 CLINICAL DATA:  chest pain EXAM: CHEST - 2 VIEW COMPARISON:  07/12/2020 and prior. FINDINGS: No focal consolidation. No pneumothorax or pleural effusion. Cardiomediastinal silhouette is within normal limits. No acute osseous abnormality. IMPRESSION: No focal airspace disease. Electronically Signed   By: Stana Buntinghikanele  Emekauwa M.D.   On: 07/13/2020 11:54   DG Chest 2 View  Result Date: 07/12/2020 CLINICAL DATA:  Chest pain and blurry vision for 1 day. EXAM: CHEST - 2 VIEW COMPARISON:  PA and lateral chest 05/04/2007. FINDINGS: Lungs clear. Heart size normal. No pneumothorax or pleural fluid. No bony abnormality. IMPRESSION: Normal chest. Electronically Signed   By: Drusilla Kannerhomas  Dalessio M.D.   On: 07/12/2020 12:16    Procedures Procedures (including critical care time)  Medications  Ordered in ED Medications  alum & mag hydroxide-simeth (MAALOX/MYLANTA) 200-200-20 MG/5ML suspension 30 mL (30 mLs Oral Given 07/13/20 1302)    And  lidocaine (XYLOCAINE) 2 % viscous mouth solution 15 mL (15 mLs Oral Given 07/13/20 1308)    ED Course  I have reviewed the triage vital signs and the nursing notes.  Pertinent labs & imaging results that were available during my care of the patient were reviewed by me and considered in my medical decision making (see chart for details).    MDM Rules/Calculators/A&P                          Patient presenting for evaluation of chest pressure/heaviness with associated intermittent shortness of breath and dizziness/lightheadedness.  On exam, she appears nontoxic.  She is seen at urgent care yesterday, had negative x-ray and EKG.  She was in the ER a few days ago, had labs drawn but never saw a provider.  Per chart review, labs overall reassuring including negative troponin.  She has no risk factors for ACS.  She is PERC negative.  I have a low suspicion for cardiac cause of chest pain.  Doubt PE, pneumonia, no thorax, or dissection.  However as patient is having chest pain, will obtain cardiac labs, EKG, chest x-ray.  If reassuring, will treat symptomatically.  Consider GERD/anxiety.  Labs interpreted by me, overall reassuring.  Troponin negative x2.  Electrolyte stable.  Hemoglobin stable.  EKG nonischemic.  Chest x-ray viewed interpreted by me, no pneumonia, pnx, effusion, cardiomegaly.  On reassessment after GI cocktail, patient reports improvement of symptoms.  I discussed likely GERD/anxiety.  Discussed that at this time, there does not appear to be a life-threatening condition requiring hospitalization.  Discussed follow-up with PCP, she has an appointment tomorrow.  At this time, patient appears safe for discharge.  Return precautions given.  Patient states she understands and agrees to plan.  Final Clinical Impression(s) / ED Diagnoses Final  diagnoses:  Atypical chest pain  Gastroesophageal reflux disease, unspecified whether esophagitis present  Anxiety    Rx / DC Orders ED Discharge Orders         Ordered    famotidine (PEPCID) 20 MG tablet  Daily        07/13/20 1358    alum & mag hydroxide-simeth (MAALOX ADVANCED MAX ST) 400-400-40 MG/5ML suspension  Every 6 hours PRN        07/13/20 1358           Alveria Apley, PA-C 07/13/20 1513    Tegeler, Canary Brim, MD 07/13/20 1728

## 2020-07-13 NOTE — Discharge Instructions (Addendum)
Continue taking antibiotic as prescribed. Take Pepcid daily to decrease stomach acid. Use Maalox as needed for severe symptoms. Follow-up with your primary care doctor to schedule appointment tomorrow. Return to the emergency room if you develop high fevers, persistent vomiting, severe worsening pain, difficulty breathing, any new, worsening, or concerning symptoms.

## 2020-07-14 LAB — URINE CULTURE: Culture: >=100000 — AB

## 2020-08-17 ENCOUNTER — Ambulatory Visit (HOSPITAL_COMMUNITY)
Admission: EM | Admit: 2020-08-17 | Discharge: 2020-08-17 | Disposition: A | Discharge status: Home / Self Care | Payer: Medicaid Other | Attending: Student | Admitting: Student

## 2020-08-17 ENCOUNTER — Encounter (HOSPITAL_COMMUNITY): Payer: Self-pay

## 2020-08-17 ENCOUNTER — Other Ambulatory Visit: Payer: Self-pay

## 2020-08-17 DIAGNOSIS — B9789 Other viral agents as the cause of diseases classified elsewhere: Secondary | ICD-10-CM | POA: Diagnosis present

## 2020-08-17 DIAGNOSIS — J028 Acute pharyngitis due to other specified organisms: Secondary | ICD-10-CM | POA: Diagnosis present

## 2020-08-17 DIAGNOSIS — Z1152 Encounter for screening for COVID-19: Secondary | ICD-10-CM

## 2020-08-17 DIAGNOSIS — U071 COVID-19: Secondary | ICD-10-CM | POA: Insufficient documentation

## 2020-08-17 DIAGNOSIS — G43001 Migraine without aura, not intractable, with status migrainosus: Secondary | ICD-10-CM | POA: Diagnosis present

## 2020-08-17 LAB — POCT RAPID STREP A, ED / UC: Streptococcus, Group A Screen (Direct): NEGATIVE

## 2020-08-17 LAB — SARS CORONAVIRUS 2 (TAT 6-24 HRS): SARS Coronavirus 2: POSITIVE — AB

## 2020-08-17 MED ORDER — EXCEDRIN MIGRAINE 250-250-65 MG PO TABS: 1.0000 | ORAL_TABLET | Freq: Four times a day (QID) | ORAL | 0 refills | Status: DC | PRN

## 2020-08-17 NOTE — Discharge Instructions (Addendum)
-  Excedrin migraine, up to every 6 hours for migraine headaches.  -You can also try tylenol/ibuprofen. You can alternate these for maximum effect. Use up to 3000mg  Tylenol daily and 3200mg  Ibuprofen daily. Make sure to take ibuprofen with food. Check the bottle of ibuprofen/tylenol for specific dosage instructions. If you are also taking excedrin, pay attention to the dosage of acetaminophen and make sure not to exceed 3000mg  tylenol daily.  We are currently awaiting result of your PCR covid-19 test. This typically comes back in 1-2 days. We'll call you if the result is positive. Otherwise, the result will be sent electronically to your MyChart. You can also call this clinic and ask for your result via telephone.   Please isolate at home while awaiting these results. If your test is positive for Covid-19, continue to isolate at home for 5 days if you have mild symptoms, or a total of 10 days from symptom onset if you have more severe symptoms. If you quarantine for a shorter period of time (i.e. 5 days), make sure to wear a mask until day 10 of symptoms. Treat your symptoms at home with OTC remedies like tylenol/ibuprofen, mucinex, nyquil, etc. Seek medical attention if you develop high fevers, chest pain, shortness of breath, ear pain, facial pain, etc. Make sure to get up and move around every 2-3 hours while convalescing to help prevent blood clots. Drink plenty of fluids, and rest as much as possible.

## 2020-08-17 NOTE — ED Triage Notes (Signed)
Patient complains of a severe headache x 4 days and has had a sore throat since yesterday. Pt is aox4 and ambulatory.

## 2020-08-17 NOTE — ED Provider Notes (Signed)
[facility]    CSN: [id number] Arrival date & time: 08/17/20  1228      History   Chief Complaint Chief Complaint  Patient presents with  . Headache    X 4 days  . Sore Throat    Since yesterday    HPI BRIANY AYE is a 20 y.o. female presenting with headaches and sore throat. History of migraine headaches for which she currently takes no medication. States she's had severe headache intermittently for 4 days, poorly controlled on ibuprofen. Endorses some photophobia but no n/v/d, phonophobia, Denies worst headache of life, thunderclap headache, weakness/sensation changes in arms/legs, vision changes, shortness of breath, chest pain/pressure, phonophobia, n/v/d.   Also states she's had sore throat x1 day. Her son currently with URI syptoms. She is requesting Covid test today. Denies fevers/chills, n/v/d, shortness of breath, chest pain, cough, congestion, facial pain, teeth pain, headaches,  loss of taste/smell, swollen lymph nodes, ear pain.      HPI  Past Medical History:  Diagnosis Date  . Anxiety   . Bipolar depression (HCC)   . Chlamydia infection affecting pregnancy 07/06/2017   tx on 07/04/17  . Depression   . Eating disorder   . GERD (gastroesophageal reflux disease)   . Hypertension in pregnancy, preeclampsia, severe, delivered 07/05/2017  . Hypomania (HCC)   . IBS (irritable bowel syndrome)   . Insomnia   . Migraines   . Miscarriage March 2016  . Mononucleosis   . Mood disturbance   . 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 Vomiting     Patient Active Problem List   Diagnosis Date Noted  . Lower urinary tract infectious disease 07/29/2019  . Chlamydia infection 08/23/2017  . Depression   . Panic attacks 07/07/2017  . 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 GAD (generalized anxiety disorder) 12/08/2014  . Bulimia nervosa 12/08/2014    Past Surgical History:  Procedure  Laterality Date  . COLONOSCOPY    . ESOPHAGOGASTRODUODENOSCOPY ENDOSCOPY      OB History    Gravida  2   Para  1   Term  0   Preterm  1   AB  1   Living  1     SAB  1   IAB  0   Ectopic  0   Multiple  0   Live Births  1            Home Medications    Prior to Admission medications   Medication Sig Start Date End Date Taking? Authorizing Provider  aspirin-acetaminophen-caffeine (EXCEDRIN MIGRAINE) [phone] MG tablet Take 1 tablet by mouth every 6 (six) hours as needed for headache. 08/17/20  Yes Rhys Martini, PA-C  famotidine (PEPCID) 20 MG tablet Take 1 tablet (20 mg total) by mouth daily. 07/13/20 08/17/20  Caccavale, Sophia, PA-C  fluticasone (FLONASE) 50 MCG/ACT nasal spray Place 1 spray into both nostrils daily. 03/28/20 08/17/20  Dahlia Byes A, NP  omeprazole (PRILOSEC) 20 MG capsule Take 1 capsule (20 mg total) by mouth daily. 06/14/20 08/17/20  Unk Lightning, PA    Family History Family History  Problem Relation Age of Onset  . Hypertension Mother   . Hypertension Father   . Mental illness Father   . Hypertension Maternal Grandmother   . Hypertension Paternal Grandmother   . Hypertension Paternal Grandfather   . Diabetes Paternal Grandfather   . Brain cancer Maternal Aunt   . Colon cancer  Maternal Aunt   . Pancreatic cancer Neg Hx   . Esophageal cancer Neg Hx     Social History Social History   Tobacco Use  . Smoking status: Passive Smoke Exposure - Never Smoker  . Smokeless tobacco: Never Used  Vaping Use  . Vaping Use: Never used  Substance Use Topics  . Alcohol use: No    Alcohol/week: 0.0 standard drinks  . Drug use: No     Allergies   Patient has no known allergies.   Review of Systems Review of Systems  Constitutional: Negative for appetite change, chills and fever.  HENT: Positive for sore throat. Negative for congestion, ear pain, rhinorrhea, sinus pressure and sinus pain.   Eyes: Positive for photophobia.  Negative for redness and visual disturbance.  Respiratory: Negative for cough, chest tightness, shortness of breath and wheezing.   Cardiovascular: Negative for chest pain and palpitations.  Gastrointestinal: Negative for abdominal pain, constipation, diarrhea, nausea and vomiting.  Genitourinary: Negative for dysuria, frequency and urgency.  Musculoskeletal: Negative for myalgias.  Neurological: Positive for headaches. Negative for dizziness, tremors, seizures, weakness, light-headedness and numbness.  Psychiatric/Behavioral: Negative for confusion.  All other systems reviewed and are negative.    Physical Exam Triage Vital Signs ED Triage Vitals  Enc Vitals Group     BP 08/17/20 1319 118/79     Pulse Rate 08/17/20 1319 91     Resp 08/17/20 1319 18     Temp 08/17/20 1319 98.2 F (36.8 C)     Temp Source 08/17/20 1319 Oral     SpO2 08/17/20 1319 99 %     Weight --      Height --      Head Circumference --      Peak Flow --      Pain Score 08/17/20 1320 10     Pain Loc --      Pain Edu? --      Excl. in GC? --    No data found.  Updated Vital Signs BP 118/79 (BP Location: Right Arm)   Pulse 91   Temp 98.2 F (36.8 C) (Oral)   Resp 18   LMP 08/10/2020 (Exact Date)   SpO2 99%   Visual Acuity Right Eye Distance:   Left Eye Distance:   Bilateral Distance:    Right Eye Near:   Left Eye Near:    Bilateral Near:     Physical Exam Vitals reviewed.  Constitutional:      General: She is not in acute distress.    Appearance: Normal appearance. She is not ill-appearing.  HENT:     Head: Normocephalic and atraumatic.     Right Ear: Hearing, tympanic membrane, ear canal and external ear normal. No swelling or tenderness. There is no impacted cerumen. No mastoid tenderness. Tympanic membrane is not perforated, erythematous, retracted or bulging.     Left Ear: Hearing, tympanic membrane, ear canal and external ear normal. No swelling or tenderness. There is no impacted  cerumen. No mastoid tenderness. Tympanic membrane is not perforated, erythematous, retracted or bulging.     Nose:     Right Sinus: No maxillary sinus tenderness or frontal sinus tenderness.     Left Sinus: No maxillary sinus tenderness or frontal sinus tenderness.     Mouth/Throat:     Mouth: Mucous membranes are moist.     Pharynx: Uvula midline. Posterior oropharyngeal erythema present. No oropharyngeal exudate.     Tonsils: No tonsillar exudate. 1+ on the right. 1+  on the left.     Comments: Smooth erythema posterior pharynx Cardiovascular:     Rate and Rhythm: Normal rate and regular rhythm.     Heart sounds: Normal heart sounds.  Pulmonary:     Breath sounds: Normal breath sounds and air entry. No wheezing, rhonchi or rales.  Chest:     Chest wall: No tenderness.  Abdominal:     General: Abdomen is flat. Bowel sounds are normal.     Tenderness: There is no abdominal tenderness. There is no guarding or rebound.  Lymphadenopathy:     Cervical: No cervical adenopathy.  Neurological:     General: No focal deficit present.     Mental Status: She is alert and oriented to person, place, and time.     Comments: CN 2-12 intact  Psychiatric:        Attention and Perception: Attention and perception normal.        Mood and Affect: Mood and affect normal.        Behavior: Behavior normal. Behavior is cooperative.        Thought Content: Thought content normal.        Judgment: Judgment normal.      UC Treatments / Results  Labs (all labs ordered are listed, but only abnormal results are displayed) Labs Reviewed  SARS CORONAVIRUS 2 (TAT 6-24 HRS)  CULTURE, GROUP A STREP [facility])  POCT RAPID STREP A, ED / UC    EKG   Radiology No results found.  Procedures Procedures (including critical care time)  Medications Ordered in UC Medications - No data to display  Initial Impression / Assessment and Plan / UC Course  I have reviewed the triage vital signs and the nursing  notes.  Pertinent labs & imaging results that were available during my care of the patient were reviewed by me and considered in my medical decision making (see chart for details).     Rapid strep negative. Culture sent. covid sent today.   For migraine headache- excedrin migraine prn. Return precautions- worst headache of life, thunderclap headache, weakness/sensation changes in arms/legs, chest pain, etc.  Final Clinical Impressions(s) / UC Diagnoses   Final diagnoses:  Migraine without aura and with status migrainosus, not intractable  Sore throat (viral)  Encounter for screening for COVID-19     Discharge Instructions     -Excedrin migraine, up to every 6 hours for migraine headaches.  -You can also try tylenol/ibuprofen. You can alternate these for maximum effect. Use up to 3000mg  Tylenol daily and 3200mg  Ibuprofen daily. Make sure to take ibuprofen with food. Check the bottle of ibuprofen/tylenol for specific dosage instructions. If you are also taking excedrin, pay attention to the dosage of acetaminophen and make sure not to exceed 3000mg  tylenol daily.  We are currently awaiting result of your PCR covid-19 test. This typically comes back in 1-2 days. We'll call you if the result is positive. Otherwise, the result will be sent electronically to your MyChart. You can also call this clinic and ask for your result via telephone.   Please isolate at home while awaiting these results. If your test is positive for Covid-19, continue to isolate at home for 5 days if you have mild symptoms, or a total of 10 days from symptom onset if you have more severe symptoms. If you quarantine for a shorter period of time (i.e. 5 days), make sure to wear a mask until day 10 of symptoms. Treat your symptoms at home with OTC  remedies like tylenol/ibuprofen, mucinex, nyquil, etc. Seek medical attention if you develop high fevers, chest pain, shortness of breath, ear pain, facial pain, etc. Make sure to  get up and move around every 2-3 hours while convalescing to help prevent blood clots. Drink plenty of fluids, and rest as much as possible.    ED Prescriptions    Medication Sig Dispense Auth. Provider   aspirin-acetaminophen-caffeine (EXCEDRIN MIGRAINE) [phone] MG tablet Take 1 tablet by mouth every 6 (six) hours as needed for headache. 30 tablet Rhys MartiniGraham, Laneisha Mino E, PA-C     PDMP not reviewed this encounter.   Rhys MartiniGraham, Kemiya Batdorf E, PA-C 08/17/20 1434

## 2020-08-20 LAB — CULTURE, GROUP A STREP (THRC)

## 2020-08-22 ENCOUNTER — Other Ambulatory Visit: Payer: Self-pay

## 2020-08-22 ENCOUNTER — Emergency Department (HOSPITAL_COMMUNITY)
Admission: EM | Admit: 2020-08-22 | Discharge: 2020-08-22 | Disposition: A | Discharge status: Home / Self Care | Payer: Medicaid Other | Attending: Emergency Medicine | Admitting: Emergency Medicine

## 2020-08-22 ENCOUNTER — Encounter (HOSPITAL_COMMUNITY): Payer: Self-pay | Admitting: Emergency Medicine

## 2020-08-22 ENCOUNTER — Ambulatory Visit (HOSPITAL_COMMUNITY)
Admission: EM | Admit: 2020-08-22 | Discharge: 2020-08-22 | Disposition: A | Discharge status: Home / Self Care | Payer: Medicaid Other

## 2020-08-22 DIAGNOSIS — R3911 Hesitancy of micturition: Secondary | ICD-10-CM | POA: Insufficient documentation

## 2020-08-22 DIAGNOSIS — M546 Pain in thoracic spine: Secondary | ICD-10-CM | POA: Insufficient documentation

## 2020-08-22 DIAGNOSIS — F419 Anxiety disorder, unspecified: Secondary | ICD-10-CM | POA: Insufficient documentation

## 2020-08-22 DIAGNOSIS — Z5321 Procedure and treatment not carried out due to patient leaving prior to being seen by health care provider: Secondary | ICD-10-CM | POA: Diagnosis not present

## 2020-08-22 LAB — URINALYSIS, ROUTINE W REFLEX MICROSCOPIC
Bilirubin Urine: NEGATIVE
Glucose, UA: NEGATIVE mg/dL
Ketones, ur: 80 mg/dL — AB
Nitrite: NEGATIVE
Protein, ur: NEGATIVE mg/dL
Specific Gravity, Urine: 1.012 (ref 1.005–1.030)
pH: 6 (ref 5.0–8.0)

## 2020-08-22 LAB — I-STAT BETA HCG BLOOD, ED (MC, WL, AP ONLY): I-stat hCG, quantitative: <5 m[IU]/mL (ref <5)

## 2020-08-22 NOTE — ED Notes (Signed)
Pt stated she was leaving.  

## 2020-08-22 NOTE — ED Triage Notes (Signed)
Pt reports upper back pain and unable to urinate.  States she last urinated at 12pm today.  Pt anxious.

## 2020-08-23 ENCOUNTER — Emergency Department (HOSPITAL_COMMUNITY)
Admission: EM | Admit: 2020-08-23 | Discharge: 2020-08-23 | Disposition: A | Discharge status: Home / Self Care | Payer: Medicaid Other | Attending: Emergency Medicine | Admitting: Emergency Medicine

## 2020-08-23 ENCOUNTER — Encounter (HOSPITAL_COMMUNITY): Payer: Self-pay

## 2020-08-23 DIAGNOSIS — Z7722 Contact with and (suspected) exposure to environmental tobacco smoke (acute) (chronic): Secondary | ICD-10-CM | POA: Diagnosis not present

## 2020-08-23 DIAGNOSIS — U071 COVID-19: Secondary | ICD-10-CM | POA: Diagnosis not present

## 2020-08-23 DIAGNOSIS — R519 Headache, unspecified: Secondary | ICD-10-CM | POA: Diagnosis present

## 2020-08-23 MED ORDER — ONDANSETRON HCL 4 MG PO TABS: 4.0000 mg | ORAL_TABLET | Freq: Three times a day (TID) | ORAL | 0 refills | Status: DC | PRN

## 2020-08-23 NOTE — ED Triage Notes (Signed)
Patient arrived with complaints of feeling dehydrated. States she came "for IV fluids so she would not have to keep drinking water"

## 2020-08-23 NOTE — ED Provider Notes (Signed)
[facility]-EMERGENCY DEPT Provider Note   CSN: [id number] Arrival date & time: 08/23/20  0544     History Chief Complaint  Patient presents with  . Dehydration    Jamie Lawrence is a 20 y.o. female.  The history is provided by the patient. No language interpreter was used.     20 year old female with history of bipolar, anxiety, recently tested positive for Covid infection approximately 5 days ago presenting requesting for IV fluid.  Patient states 5 days ago she developed some headache.  She had a Covid test done and it came back positive.  Since then she mentions she does not have any other symptoms and her headache has since resolved.  She however endorsed feeling dehydrated despite drinking plenty of fluid and able to keep down.  No nausea vomiting or diarrhea.  She is here requesting for IV fluid only.  She has not been vaccinated for COVID-19.  She denies any active pain.  She reports she did not urinate yesterday but she did urinate today.  No dysuria.  Past Medical History:  Diagnosis Date  . Anxiety   . Bipolar depression (HCC)   . Chlamydia infection affecting pregnancy 07/06/2017   tx on 07/04/17  . Depression   . Eating disorder   . GERD (gastroesophageal reflux disease)   . Hypertension in pregnancy, preeclampsia, severe, delivered 07/05/2017  . Hypomania (HCC)   . IBS (irritable bowel syndrome)   . Insomnia   . Migraines   . Miscarriage March 2016  . Mononucleosis   . Mood disturbance   . 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 Vomiting     Patient Active Problem List   Diagnosis Date Noted  . Lower urinary tract infectious disease 07/29/2019  . Chlamydia infection 08/23/2017  . Depression   . Panic attacks 07/07/2017  . 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 GAD (generalized anxiety disorder) 12/08/2014  . Bulimia nervosa 12/08/2014    Past Surgical History:   Procedure Laterality Date  . COLONOSCOPY    . ESOPHAGOGASTRODUODENOSCOPY ENDOSCOPY       OB History    Gravida  2   Para  1   Term  0   Preterm  1   AB  1   Living  1     SAB  1   IAB  0   Ectopic  0   Multiple  0   Live Births  1           Family History  Problem Relation Age of Onset  . Hypertension Mother   . Hypertension Father   . Mental illness Father   . Hypertension Maternal Grandmother   . Hypertension Paternal Grandmother   . Hypertension Paternal Grandfather   . Diabetes Paternal Grandfather   . Brain cancer Maternal Aunt   . Colon cancer Maternal Aunt   . Pancreatic cancer Neg Hx   . Esophageal cancer Neg Hx     Social History   Tobacco Use  . Smoking status: Passive Smoke Exposure - Never Smoker  . Smokeless tobacco: Never Used  Vaping Use  . Vaping Use: Never used  Substance Use Topics  . Alcohol use: No    Alcohol/week: 0.0 standard drinks  . Drug use: No    Home Medications Prior to Admission medications   Medication Sig Start Date End Date Taking? Authorizing Provider  aspirin-acetaminophen-caffeine (EXCEDRIN MIGRAINE) [phone] MG tablet Take 1 tablet by  mouth every 6 (six) hours as needed for headache. 08/17/20   Rhys Martini, PA-C  famotidine (PEPCID) 20 MG tablet Take 1 tablet (20 mg total) by mouth daily. 07/13/20 08/17/20  Caccavale, Sophia, PA-C  fluticasone (FLONASE) 50 MCG/ACT nasal spray Place 1 spray into both nostrils daily. 03/28/20 08/17/20  Dahlia Byes A, NP  omeprazole (PRILOSEC) 20 MG capsule Take 1 capsule (20 mg total) by mouth daily. 06/14/20 08/17/20  Unk Lightning, PA    Allergies    Patient has no known allergies.  Review of Systems   Review of Systems  All other systems reviewed and are negative.   Physical Exam Updated Vital Signs BP (!) 125/97 (BP Location: Right Arm)   Pulse 96   Temp 98.5 F (36.9 C) (Oral)   Resp 16   Ht 4\' 11"  (1.499 m)   Wt 64.9 kg   LMP 08/10/2020 (Exact  Date)   SpO2 100%   BMI 28.88 kg/m   Physical Exam Vitals and nursing note reviewed.  Constitutional:      General: She is not in acute distress.    Appearance: She is well-developed and well-nourished.  HENT:     Head: Atraumatic.     Mouth/Throat:     Mouth: Mucous membranes are moist.     Comments: Mouth is moist Eyes:     Conjunctiva/sclera: Conjunctivae normal.  Cardiovascular:     Rate and Rhythm: Normal rate and regular rhythm.     Pulses: Normal pulses.     Heart sounds: Normal heart sounds.  Pulmonary:     Effort: Pulmonary effort is normal.     Breath sounds: Normal breath sounds.  Abdominal:     Palpations: Abdomen is soft.  Musculoskeletal:     Cervical back: Neck supple.  Skin:    Findings: No rash.     Comments: Normal skin turgor  Neurological:     Mental Status: She is alert and oriented to person, place, and time.  Psychiatric:        Mood and Affect: Mood and affect and mood normal.     ED Results / Procedures / Treatments   Labs (all labs ordered are listed, but only abnormal results are displayed) Labs Reviewed - No data to display  EKG None  Radiology No results found.  Procedures Procedures   Medications Ordered in ED Medications - No data to display  ED Course  I have reviewed the triage vital signs and the nursing notes.  Pertinent labs & imaging results that were available during my care of the patient were reviewed by me and considered in my medical decision making (see chart for details).    MDM Rules/Calculators/A&P                          BP (!) 125/97 (BP Location: Right Arm)   Pulse 96   Temp 98.5 F (36.9 C) (Oral)   Resp 16   Ht 4\' 11"  (1.499 m)   Wt 64.9 kg   LMP 08/10/2020 (Exact Date)   SpO2 100%   BMI 28.88 kg/m   Final Clinical Impression(s) / ED Diagnoses Final diagnoses:  COVID    Rx / DC Orders ED Discharge Orders    None     10:04 AM Patient test positive for COVID-19 5 days ago.  She is  here requesting for IV fluid reports she feels dehydrated.  Patient does not have any objective finding of  dehydration.  Mouth is moist, normal skin turgor, vital signs stable, no report of nausea vomiting or diarrhea.  Reassurance given, patient stable for discharge.  Return precaution given.  PACEY WILLADSEN was evaluated in Emergency Department on 08/23/2020 for the symptoms described in the history of present illness. She was evaluated in the context of the global COVID-19 pandemic, which necessitated consideration that the patient might be at risk for infection with the SARS-CoV-2 virus that causes COVID-19. Institutional protocols and algorithms that pertain to the evaluation of patients at risk for COVID-19 are in a state of rapid change based on information released by regulatory bodies including the CDC and federal and state organizations. These policies and algorithms were followed during the patient's care in the ED.    Fayrene Helper, PA-C 08/23/20 1007    Little, Ambrose [place], MD 08/23/20 [phone]

## 2020-08-23 NOTE — ED Notes (Signed)
Older lady that was with the patient was very upset and stated "that if something happens to the patient she was going to sue [place] [employer]fuckers".  I tried to explain the process to the patient and visitor however she was still upset.

## 2020-08-23 NOTE — Discharge Instructions (Signed)
Recommendations for at home COVID-19 symptoms management:  Please continue isolation at home. Call [phone] to see whether you might be eligible for therapeutic antibody infusions (leave your name and they will call you back).  If have acute worsening of symptoms please go to ER/urgent care for further evaluation. Check pulse oximetry and if below 90-92% please go to ER. The following supplements MAY help:  Vitamin C 500mg twice a day and Quercetin 250-500 mg twice a day Vitamin D3 2000 - 4000 u/day B Complex vitamins Zinc 75-100 mg/day Melatonin 6-10 mg at night (the optimal dose is unknown) Aspirin 81mg/day (if no history of bleeding issues)  

## 2020-09-05 ENCOUNTER — Encounter (HOSPITAL_COMMUNITY): Payer: Self-pay | Admitting: *Deleted

## 2020-09-05 ENCOUNTER — Emergency Department (HOSPITAL_COMMUNITY)
Admission: EM | Admit: 2020-09-05 | Discharge: 2020-09-05 | Disposition: A | Discharge status: Home / Self Care | Payer: Medicaid Other | Attending: Emergency Medicine | Admitting: Emergency Medicine

## 2020-09-05 ENCOUNTER — Other Ambulatory Visit: Payer: Self-pay

## 2020-09-05 ENCOUNTER — Encounter (HOSPITAL_COMMUNITY): Payer: Self-pay

## 2020-09-05 ENCOUNTER — Ambulatory Visit (HOSPITAL_COMMUNITY)
Admission: EM | Admit: 2020-09-05 | Discharge: 2020-09-05 | Disposition: A | Discharge status: Home / Self Care | Payer: Medicaid Other | Attending: Physician Assistant | Admitting: Physician Assistant

## 2020-09-05 DIAGNOSIS — M545 Low back pain, unspecified: Secondary | ICD-10-CM | POA: Diagnosis present

## 2020-09-05 DIAGNOSIS — R109 Unspecified abdominal pain: Secondary | ICD-10-CM | POA: Insufficient documentation

## 2020-09-05 DIAGNOSIS — Z5321 Procedure and treatment not carried out due to patient leaving prior to being seen by health care provider: Secondary | ICD-10-CM | POA: Diagnosis not present

## 2020-09-05 DIAGNOSIS — Z87442 Personal history of urinary calculi: Secondary | ICD-10-CM | POA: Insufficient documentation

## 2020-09-05 LAB — URINALYSIS, ROUTINE W REFLEX MICROSCOPIC
Bilirubin Urine: NEGATIVE
Glucose, UA: NEGATIVE mg/dL
Ketones, ur: 5 mg/dL — AB
Leukocytes,Ua: NEGATIVE
Nitrite: NEGATIVE
Protein, ur: NEGATIVE mg/dL
RBC / HPF: >50 RBC/hpf — ABNORMAL HIGH (ref 0–5)
Specific Gravity, Urine: 1.013 (ref 1.005–1.030)
pH: 6 (ref 5.0–8.0)

## 2020-09-05 LAB — POCT URINALYSIS DIPSTICK, ED / UC
Bilirubin Urine: NEGATIVE
Glucose, UA: NEGATIVE mg/dL
Leukocytes,Ua: NEGATIVE
Nitrite: POSITIVE — AB
Protein, ur: NEGATIVE mg/dL
Specific Gravity, Urine: 1.02 (ref 1.005–1.030)
Urobilinogen, UA: 0.2 mg/dL (ref 0.0–1.0)
pH: 6 (ref 5.0–8.0)

## 2020-09-05 MED ORDER — CYCLOBENZAPRINE HCL 5 MG PO TABS: 5.0000 mg | ORAL_TABLET | Freq: Three times a day (TID) | ORAL | 0 refills | Status: AC | PRN

## 2020-09-05 NOTE — ED Triage Notes (Signed)
Reports rt flank pain since Wednesday, has history of kidney stones and UTI's. Denies N/V/D

## 2020-09-05 NOTE — ED Provider Notes (Signed)
[facility]    CSN: [id number] Arrival date & time: 09/05/20  1329      History   Chief Complaint Chief Complaint  Patient presents with  . Flank Pain    HPI Jamie Lawrence is a 20 y.o. female.   Pt complains of left sided lower back pain that started Wednesday.  Denies injury or trauma.  She denies radiation of pain, numbness, tingling, abdominal pain, dysuria, increased urinary frequency, urinary hesitancy.  She reports the back pain is worse with movement and with walking.  She has tried ibuprofen with temporary relief.      Past Medical History:  Diagnosis Date  . Anxiety   . Bipolar depression (HCC)   . Chlamydia infection affecting pregnancy 07/06/2017   tx on 07/04/17  . Depression   . Eating disorder   . GERD (gastroesophageal reflux disease)   . Hypertension in pregnancy, preeclampsia, severe, delivered 07/05/2017  . Hypomania (HCC)   . IBS (irritable bowel syndrome)   . Insomnia   . Migraines   . Miscarriage March 2016  . Mononucleosis   . Mood disturbance   . 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 Vomiting     Patient Active Problem List   Diagnosis Date Noted  . Lower urinary tract infectious disease 07/29/2019  . Chlamydia infection 08/23/2017  . Depression   . Panic attacks 07/07/2017  . 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 GAD (generalized anxiety disorder) 12/08/2014  . Bulimia nervosa 12/08/2014    Past Surgical History:  Procedure Laterality Date  . COLONOSCOPY    . ESOPHAGOGASTRODUODENOSCOPY ENDOSCOPY      OB History    Gravida  2   Para  1   Term  0   Preterm  1   AB  1   Living  1     SAB  1   IAB  0   Ectopic  0   Multiple  0   Live Births  1            Home Medications    Prior to Admission medications   Medication Sig Start Date End Date Taking? Authorizing Provider  cyclobenzaprine (FLEXERIL) 5 MG tablet Take 1 tablet  (5 mg total) by mouth 3 (three) times daily as needed for up to 5 days for muscle spasms. 09/05/20 09/10/20 Yes Americus Scheurich, Shanda Bumps, PA-C  aspirin-acetaminophen-caffeine (EXCEDRIN MIGRAINE) [phone] MG tablet Take 1 tablet by mouth every 6 (six) hours as needed for headache. 08/17/20   Rhys Martini, PA-C  ondansetron (ZOFRAN) 4 MG tablet Take 1 tablet (4 mg total) by mouth every 8 (eight) hours as needed for nausea or vomiting. 08/23/20   Fayrene Helper, PA-C  famotidine (PEPCID) 20 MG tablet Take 1 tablet (20 mg total) by mouth daily. 07/13/20 08/17/20  Caccavale, Sophia, PA-C  fluticasone (FLONASE) 50 MCG/ACT nasal spray Place 1 spray into both nostrils daily. 03/28/20 08/17/20  Dahlia Byes A, NP  omeprazole (PRILOSEC) 20 MG capsule Take 1 capsule (20 mg total) by mouth daily. 06/14/20 08/17/20  Unk Lightning, PA    Family History Family History  Problem Relation Age of Onset  . Hypertension Mother   . Hypertension Father   . Mental illness Father   . Hypertension Maternal Grandmother   . Hypertension Paternal Grandmother   . Hypertension Paternal Grandfather   . Diabetes Paternal Grandfather   . Brain cancer Maternal Aunt   . Colon cancer  Maternal Aunt   . Pancreatic cancer Neg Hx   . Esophageal cancer Neg Hx     Social History Social History   Tobacco Use  . Smoking status: Passive Smoke Exposure - Never Smoker  . Smokeless tobacco: Never Used  Vaping Use  . Vaping Use: Never used  Substance Use Topics  . Alcohol use: No    Alcohol/week: 0.0 standard drinks  . Drug use: No     Allergies   Patient has no known allergies.   Review of Systems Review of Systems  Constitutional: Negative for chills and fever.  HENT: Negative for ear pain and sore throat.   Eyes: Negative for pain and visual disturbance.  Respiratory: Negative for cough and shortness of breath.   Cardiovascular: Negative for chest pain and palpitations.  Gastrointestinal: Negative for abdominal pain and  vomiting.  Genitourinary: Negative for decreased urine volume, difficulty urinating, dysuria, hematuria and urgency.  Musculoskeletal: Positive for back pain (right lower). Negative for arthralgias.  Skin: Negative for color change and rash.  Neurological: Negative for seizures and syncope.  All other systems reviewed and are negative.    Physical Exam Triage Vital Signs ED Triage Vitals  Enc Vitals Group     BP 09/05/20 1427 138/85     Pulse Rate 09/05/20 1427 97     Resp 09/05/20 1427 16     Temp 09/05/20 1427 99 F (37.2 C)     Temp Source 09/05/20 1427 Oral     SpO2 09/05/20 1427 100 %     Weight --      Height --      Head Circumference --      Peak Flow --      Pain Score 09/05/20 1428 9     Pain Loc --      Pain Edu? --      Excl. in GC? --    No data found.  Updated Vital Signs BP 138/85 (BP Location: Right Arm)   Pulse 97   Temp 99 F (37.2 C) (Oral)   Resp 16   LMP 08/10/2020 (Exact Date)   SpO2 100%   Visual Acuity Right Eye Distance:   Left Eye Distance:   Bilateral Distance:    Right Eye Near:   Left Eye Near:    Bilateral Near:     Physical Exam Vitals and nursing note reviewed.  Constitutional:      General: She is not in acute distress.    Appearance: She is well-developed and well-nourished.  HENT:     Head: Normocephalic and atraumatic.  Eyes:     Conjunctiva/sclera: Conjunctivae normal.  Cardiovascular:     Rate and Rhythm: Normal rate and regular rhythm.     Heart sounds: No murmur heard.   Pulmonary:     Effort: Pulmonary effort is normal. No respiratory distress.     Breath sounds: Normal breath sounds.  Abdominal:     General: Bowel sounds are normal.     Palpations: Abdomen is soft.     Tenderness: There is no abdominal tenderness. There is no right CVA tenderness or left CVA tenderness.  Musculoskeletal:        General: No edema.     Cervical back: Neck supple.     Lumbar back: Tenderness (right lumbar paraspinal  muscular TTP with spasm noted) present.  Skin:    General: Skin is warm and dry.  Neurological:     Mental Status: She is alert.  Psychiatric:  Mood and Affect: Mood and affect normal.      UC Treatments / Results  Labs (all labs ordered are listed, but only abnormal results are displayed) Labs Reviewed  URINE CULTURE  POCT URINALYSIS DIPSTICK, ED / UC    EKG   Radiology No results found.  Procedures Procedures (including critical care time)  Medications Ordered in UC Medications - No data to display  Initial Impression / Assessment and Plan / UC Course  I have reviewed the triage vital signs and the nursing notes.  Pertinent labs & imaging results that were available during my care of the patient were reviewed by me and considered in my medical decision making (see chart for details).     Pt denies urinary sx, no CVA tenderness on exam.  UA negative for nitrites, leukocytes. She does have musculoskeletal tenderness with palpation to right lower back with spasm noted.  She reports pain is worse with movement and walking, consistent with musculoskeletal pain.  She will continue with ibuprofen as needed. Flexeril prescribed prn.  Return precautions discussed.  Final Clinical Impressions(s) / UC Diagnoses   Final diagnoses:  Acute right-sided low back pain without sciatica     Discharge Instructions     Recommend ice to affected area Can continue with Ibuprofen as needed Recommend light stretching, walking   ED Prescriptions    Medication Sig Dispense Auth. Provider   cyclobenzaprine (FLEXERIL) 5 MG tablet Take 1 tablet (5 mg total) by mouth 3 (three) times daily as needed for up to 5 days for muscle spasms. 15 tablet Jodell Cipro, PA-C     PDMP not reviewed this encounter.   Jodell Cipro, PA-C 09/05/20 1455

## 2020-09-05 NOTE — ED Triage Notes (Signed)
Pt present flank and lower back pain since Wednesday. Pt states she is going to the bathroom more frequently then normal.

## 2020-09-05 NOTE — Discharge Instructions (Signed)
Recommend ice to affected area Can continue with Ibuprofen as needed Recommend light stretching, walking

## 2020-09-07 ENCOUNTER — Telehealth (HOSPITAL_COMMUNITY): Payer: Self-pay | Admitting: Emergency Medicine

## 2020-09-07 LAB — URINE CULTURE: Culture: >=100000 — AB

## 2020-09-07 MED ORDER — NITROFURANTOIN MONOHYD MACRO 100 MG PO CAPS: 100.0000 mg | ORAL_CAPSULE | Freq: Two times a day (BID) | ORAL | 0 refills | Status: DC

## 2020-09-11 ENCOUNTER — Other Ambulatory Visit: Payer: Self-pay

## 2020-09-11 ENCOUNTER — Emergency Department (HOSPITAL_COMMUNITY)
Admission: EM | Admit: 2020-09-11 | Discharge: 2020-09-11 | Disposition: A | Discharge status: Home / Self Care | Payer: Medicaid Other | Attending: Emergency Medicine | Admitting: Emergency Medicine

## 2020-09-11 ENCOUNTER — Emergency Department (HOSPITAL_COMMUNITY): Payer: Medicaid Other

## 2020-09-11 DIAGNOSIS — M436 Torticollis: Secondary | ICD-10-CM | POA: Diagnosis not present

## 2020-09-11 DIAGNOSIS — Z7722 Contact with and (suspected) exposure to environmental tobacco smoke (acute) (chronic): Secondary | ICD-10-CM | POA: Insufficient documentation

## 2020-09-11 DIAGNOSIS — M542 Cervicalgia: Secondary | ICD-10-CM | POA: Diagnosis present

## 2020-09-11 MED ORDER — IBUPROFEN 200 MG PO TABS
600.0000 mg | ORAL_TABLET | Freq: Once | ORAL | Status: AC
  Administered 2020-09-11: 600 mg via ORAL
  Filled 2020-09-11 (×2): qty 3

## 2020-09-11 MED ORDER — METHOCARBAMOL 500 MG PO TABS: 500.0000 mg | ORAL_TABLET | Freq: Two times a day (BID) | ORAL | 0 refills | Status: DC

## 2020-09-11 MED ORDER — METHOCARBAMOL 500 MG PO TABS
500.0000 mg | ORAL_TABLET | Freq: Once | ORAL | Status: AC
  Administered 2020-09-11: 500 mg via ORAL
  Filled 2020-09-11: qty 1

## 2020-09-11 MED ORDER — ACETAMINOPHEN 325 MG PO TABS
650.0000 mg | ORAL_TABLET | Freq: Once | ORAL | Status: AC
  Administered 2020-09-11: 650 mg via ORAL
  Filled 2020-09-11: qty 2

## 2020-09-11 NOTE — Discharge Instructions (Signed)
Take the medications as needed to help with your symptoms. Apply heating pad to the area and gradually practice range of motion to prevent stiffness which can cause further pain. Follow-up with your primary care provider. Return to the ER if you start to experience worsening pain, injuries or falls, numbness in arms or legs, vision changes or fever.

## 2020-09-11 NOTE — ED Provider Notes (Signed)
[facility]ENC[facility]PT Provider Note   CSN: [id number] Arrival date & time: 09/11/20  0931     History Chief Complaint  Patient presents with  . Neck Pain    Jamie Lawrence is a 20 y.o. female with a past medical history of GERD presenting to the ED with a chief complaint of neck pain.  When she woke up this morning states that she sat up and while she was stretching and rotating her neck, felt a "grinding" sensation to the right side of her neck.  After that she started experiencing sharp pain that radiated down her right shoulder and is mostly localized to the right paraspinal area of the cervical spine.  She denies any pain like this in the past.  She denies any headache, vision changes, neck stiffness prior to stretching, prior neck surgeries, fever, dizziness, lightheadedness, numbness in arms or legs, paresthesias.  Denies possibility of pregnancy.  She has not taken any medications to help with her pain as she states that she came straight to the ER.  HPI     Past Medical History:  Diagnosis Date  . Anxiety   . Bipolar depression (HCC)   . Chlamydia infection affecting pregnancy 07/06/2017   tx on 07/04/17  . Depression   . Eating disorder   . GERD (gastroesophageal reflux disease)   . Hypertension in pregnancy, preeclampsia, severe, delivered 07/05/2017  . Hypomania (HCC)   . IBS (irritable bowel syndrome)   . Insomnia   . Migraines   . Miscarriage March 2016  . Mononucleosis   . Mood disturbance   . 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 Vomiting     Patient Active Problem List   Diagnosis Date Noted  . Lower urinary tract infectious disease 07/29/2019  . Chlamydia infection 08/23/2017  . Depression   . Panic attacks 07/07/2017  . 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 GAD (generalized anxiety disorder) 12/08/2014  . Bulimia nervosa 12/08/2014    Past Surgical  History:  Procedure Laterality Date  . COLONOSCOPY    . ESOPHAGOGASTRODUODENOSCOPY ENDOSCOPY       OB History    Gravida  2   Para  1   Term  0   Preterm  1   AB  1   Living  1     SAB  1   IAB  0   Ectopic  0   Multiple  0   Live Births  1           Family History  Problem Relation Age of Onset  . Hypertension Mother   . Hypertension Father   . Mental illness Father   . Hypertension Maternal Grandmother   . Hypertension Paternal Grandmother   . Hypertension Paternal Grandfather   . Diabetes Paternal Grandfather   . Brain cancer Maternal Aunt   . Colon cancer Maternal Aunt   . Pancreatic cancer Neg Hx   . Esophageal cancer Neg Hx     Social History   Tobacco Use  . Smoking status: Passive Smoke Exposure - Never Smoker  . Smokeless tobacco: Never Used  Vaping Use  . Vaping Use: Never used  Substance Use Topics  . Alcohol use: No    Alcohol/week: 0.0 standard drinks  . Drug use: No    Home Medications Prior to Admission medications   Medication Sig Start Date End Date Taking? Authorizing Provider  methocarbamol (ROBAXIN) 500 MG tablet Take  1 tablet (500 mg total) by mouth 2 (two) times daily. 09/11/20  Yes Makih Stefanko, PA-C  nitrofurantoin, macrocrystal-monohydrate, (MACROBID) 100 MG capsule Take 1 capsule (100 mg total) by mouth 2 (two) times daily. 09/07/20   Merrilee Jansky, MD  aspirin-acetaminophen-caffeine (EXCEDRIN MIGRAINE) [phone] MG tablet Take 1 tablet by mouth every 6 (six) hours as needed for headache. 08/17/20   Rhys Martini, PA-C  ondansetron (ZOFRAN) 4 MG tablet Take 1 tablet (4 mg total) by mouth every 8 (eight) hours as needed for nausea or vomiting. 08/23/20   Fayrene Helper, PA-C  famotidine (PEPCID) 20 MG tablet Take 1 tablet (20 mg total) by mouth daily. 07/13/20 08/17/20  Caccavale, Sophia, PA-C  fluticasone (FLONASE) 50 MCG/ACT nasal spray Place 1 spray into both nostrils daily. 03/28/20 08/17/20  Dahlia Byes A, NP   omeprazole (PRILOSEC) 20 MG capsule Take 1 capsule (20 mg total) by mouth daily. 06/14/20 08/17/20  Unk Lightning, PA    Allergies    Patient has no known allergies.  Review of Systems   Review of Systems  Constitutional: Negative for appetite change, chills and fever.  HENT: Negative for ear pain, rhinorrhea, sneezing and sore throat.   Eyes: Negative for photophobia and visual disturbance.  Respiratory: Negative for cough, chest tightness, shortness of breath and wheezing.   Cardiovascular: Negative for chest pain and palpitations.  Gastrointestinal: Negative for abdominal pain, blood in stool, constipation, diarrhea, nausea and vomiting.  Genitourinary: Negative for dysuria, hematuria and urgency.  Musculoskeletal: Positive for myalgias and neck pain.  Skin: Negative for rash.  Neurological: Negative for dizziness, weakness and light-headedness.    Physical Exam Updated Vital Signs BP 134/86 (BP Location: Left Arm)   Pulse 87   Temp 98.2 F (36.8 C) (Oral)   Resp 14   Ht 4\' 11"  (1.499 m)   Wt 63.3 kg   LMP 09/03/2020   SpO2 100%   BMI 28.18 kg/m   Physical Exam Vitals and nursing note reviewed.  Constitutional:      General: She is not in acute distress.    Appearance: She is well-developed and well-nourished.  HENT:     Head: Normocephalic and atraumatic.     Nose: Nose normal.  Eyes:     General: No scleral icterus.       Right eye: No discharge.        Left eye: No discharge.     Extraocular Movements: EOM normal.     Conjunctiva/sclera: Conjunctivae normal.  Neck:      Comments: Tenderness to palpation of the right paraspinal musculature near the cervical spine as noted in the image.  Strength 5/5 in bilateral upper extremities.  Normal sensation to light touch of bilateral upper extremities. Cardiovascular:     Rate and Rhythm: Normal rate and regular rhythm.     Pulses: Intact distal pulses.     Heart sounds: Normal heart sounds. No murmur  heard. No friction rub. No gallop.   Pulmonary:     Effort: Pulmonary effort is normal. No respiratory distress.     Breath sounds: Normal breath sounds.  Abdominal:     General: Bowel sounds are normal. There is no distension.     Palpations: Abdomen is soft.     Tenderness: There is no abdominal tenderness. There is no guarding.  Musculoskeletal:        General: No edema. Normal range of motion.     Cervical back: Normal range of motion and neck supple.  Muscular tenderness present. No spinous process tenderness.  Skin:    General: Skin is warm and dry.     Findings: No rash.  Neurological:     Mental Status: She is alert and oriented to person, place, and time.     Cranial Nerves: No cranial nerve deficit.     Sensory: No sensory deficit.     Motor: No weakness or abnormal muscle tone.     Coordination: Coordination normal.  Psychiatric:        Mood and Affect: Mood and affect normal.     ED Results / Procedures / Treatments   Labs (all labs ordered are listed, but only abnormal results are displayed) Labs Reviewed - No data to display  EKG None  Radiology CT Cervical Spine Wo Contrast  Result Date: 09/11/2020 CLINICAL DATA:  Cervical radiculopathy.  Pain and right-side of neck EXAM: CT CERVICAL SPINE WITHOUT CONTRAST TECHNIQUE: Multidetector CT imaging of the cervical spine was performed without intravenous contrast. Multiplanar CT image reconstructions were also generated. COMPARISON:  None. FINDINGS: Alignment: Normal alignment. Skull base and vertebrae: No acute fracture. No primary bone lesion or focal pathologic process. Soft tissues and spinal canal: No prevertebral fluid or swelling. No visible canal hematoma. Disc levels: Disc spaces are well preserved. No significant degenerative changes. Upper chest: Negative. Other: None IMPRESSION: Normal appearance of the cervical spine. Electronically Signed   By: Signa Kell M.D.   On: 09/11/2020 11:05     Procedures Procedures   Medications Ordered in ED Medications  ibuprofen (ADVIL) tablet 600 mg (has no administration in time range)  methocarbamol (ROBAXIN) tablet 500 mg (has no administration in time range)  acetaminophen (TYLENOL) tablet 650 mg (has no administration in time range)    ED Course  I have reviewed the triage vital signs and the nursing notes.  Pertinent labs & imaging results that were available during my care of the patient were reviewed by me and considered in my medical decision making (see chart for details).    MDM Rules/Calculators/A&P                          20 year old female presenting to the ED for right lateral neck pain after stretching and rolling her neck this morning when she woke up.  Denies any injury or trauma.  She did not have any pain when she first woke up and instead this occurred after she practice range of motion.  States the pain will radiate down her right shoulder.  She denies any prior neck surgeries, fever, numbness in arms or legs, vision changes, vomiting or headache.  On exam she has no neurological deficits.  She does have tenderness palpation of the right paraspinal musculature near the cervical spine without any specific midline tenderness.  No weakness or numbness noted on exam.  Vital signs are within normal limits.  CT of the cervical spine without any acute abnormalities.  Suspect symptoms are musculoskeletal in nature.  Doubt infectious or vascular cause based on her age and physical exam findings.  Will have her take muscle relaxer, anti-inflammatories and heat therapy.  Return precautions given.   Patient is hemodynamically stable, in NAD, and able to ambulate in the ED. Evaluation does not show pathology that would require ongoing emergent intervention or inpatient treatment. I explained the diagnosis to the patient. Pain has been managed and has no complaints prior to discharge. Patient is comfortable with above plan and is  stable for discharge at this time. All questions were answered prior to disposition. Strict return precautions for returning to the ED were discussed. Encouraged follow up with PCP.   An After Visit Summary was printed and given to the patient.   Portions of this note were generated with Scientist, clinical (histocompatibility and immunogenetics). Dictation errors may occur despite best attempts at proofreading.  Final Clinical Impression(s) / ED Diagnoses Final diagnoses:  Torticollis    Rx / DC Orders ED Discharge Orders         Ordered    methocarbamol (ROBAXIN) 500 MG tablet  2 times daily        09/11/20 [address] PA-C 09/11/20 1119    Tegeler, Canary Brim, MD 09/11/20 [phone]

## 2020-09-11 NOTE — ED Notes (Signed)
Patient attempted to sign for dc with Irving Burton, RN, but signature pad not connecting to computer.

## 2020-09-11 NOTE — ED Triage Notes (Addendum)
Patient states she was feeling some neck tightness and when she tried to roll her neck she heard a grinding noise and experienced some sharp pain on right side of neck radiating to rightt shoulder. Patient denies any trauma to area or previous injuries. Pain rating 10/10. Patient placed in c-collar by EMS

## 2020-12-08 ENCOUNTER — Encounter (HOSPITAL_COMMUNITY): Payer: Self-pay

## 2020-12-08 ENCOUNTER — Other Ambulatory Visit: Payer: Self-pay

## 2020-12-08 ENCOUNTER — Ambulatory Visit (INDEPENDENT_AMBULATORY_CARE_PROVIDER_SITE_OTHER): Payer: Medicaid Other

## 2020-12-08 ENCOUNTER — Ambulatory Visit (HOSPITAL_COMMUNITY)
Admission: EM | Admit: 2020-12-08 | Discharge: 2020-12-08 | Disposition: A | Discharge status: Home / Self Care | Payer: Medicaid Other | Attending: Family Medicine | Admitting: Family Medicine

## 2020-12-08 DIAGNOSIS — M79671 Pain in right foot: Secondary | ICD-10-CM

## 2020-12-08 DIAGNOSIS — S9031XA Contusion of right foot, initial encounter: Secondary | ICD-10-CM | POA: Diagnosis not present

## 2020-12-08 NOTE — ED Triage Notes (Signed)
Pt in with c/o right foot pain that started last night when a metal stool dropped on her foot  Pt states today the pain is radiating up her leg to her hip

## 2020-12-08 NOTE — ED Provider Notes (Addendum)
[facility]   [id number] 12/08/20 Arrival Time: 1444  ASSESSMENT & PLAN:  1. Contusion of right foot, initial encounter     I have personally viewed the imaging studies ordered this visit. No fractures appreciated.  Ibuprofen. WBAT.  Orders Placed This Encounter  Procedures  . DG Foot Complete Right    Recommend:  Follow-up Information    [facility].   Why: As needed. Contact information: [address] [place] [address] [phone]        [facility].   Specialty: Urgent Care Why: If worsening or failing to improve as anticipated. Contact information: [address] [place] [address] [phone]              Reviewed expectations re: course of current medical issues. Questions answered. Outlined signs and symptoms indicating need for more acute intervention. Patient verbalized understanding. After Visit Summary given.  SUBJECTIVE: History from: patient. Jamie Lawrence is a 20 y.o. female who reports R dorsal foot pain s/p dropping heavy object onto foot; yesterday; able to bear wt but with discomfort. No extremity sensation changes or weakness. No tx PTA. Pain does radiate up lateral lower leg.  Past Surgical History:  Procedure Laterality Date  . COLONOSCOPY    . ESOPHAGOGASTRODUODENOSCOPY ENDOSCOPY        OBJECTIVE:  Vitals:   12/08/20 1457 12/08/20 1458  BP:  129/79  Pulse:  92  Resp: 19   Temp: 99.6 F (37.6 C)   TempSrc: Oral   SpO2:  100%    General appearance: alert; no distress HEENT: [place]; AT Neck: supple with FROM Resp: unlabored respirations Extremities: . RLE: warm with well perfused appearance; poorly localized moderate tenderness over right dorsal foot; without gross deformities; swelling: minimal; bruising: none; ankle and toes with FROM CV: brisk extremity capillary refill of RLE; 2+ DP pulse of RLE. Skin: warm and dry; no visible  rashes Neurologic: gait normal; normal sensation and strength of RLE Psychological: alert and cooperative; normal mood and affect  Imaging: DG Foot Complete Right  Result Date: 12/08/2020 CLINICAL DATA:  Trauma, right foot pain EXAM: RIGHT FOOT COMPLETE - 3+ VIEW COMPARISON:  None. FINDINGS: There is no evidence of fracture or dislocation. There is no evidence of arthropathy or other focal bone abnormality. Soft tissues are unremarkable. IMPRESSION: Negative. Electronically Signed   By: Jasmine Pang M.D.   On: 12/08/2020 15:46      No Known Allergies  Past Medical History:  Diagnosis Date  . Anxiety   . Bipolar depression (HCC)   . Chlamydia infection affecting pregnancy 07/06/2017   tx on 07/04/17  . Depression   . Eating disorder   . GERD (gastroesophageal reflux disease)   . Hypertension in pregnancy, preeclampsia, severe, delivered 07/05/2017  . Hypomania (HCC)   . IBS (irritable bowel syndrome)   . Insomnia   . Migraines   . Miscarriage March 2016  . Mononucleosis   . Mood disturbance   . 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 Vomiting    Social History   Socioeconomic History  . Marital status: Single    Spouse name: Not on file  . Number of children: Not on file  . Years of education: Not on file  . Highest education level: Not on file  Occupational History  . Occupation: Consulting civil engineer    Comment: Engineer, site  Tobacco Use  . Smoking status: Passive Smoke Exposure - Never Smoker  .  Smokeless tobacco: Never Used  Vaping Use  . Vaping Use: Never used  Substance and Sexual Activity  . Alcohol use: No    Alcohol/week: 0.0 standard drinks  . Drug use: No  . Sexual activity: Not Currently    Partners: Male    Birth control/protection: None  Other Topics Concern  . Not on file  Social History Narrative  . Not on file   Social Determinants of Health   Financial Resource Strain: Not on file  Food Insecurity: Not on file  Transportation  Needs: Not on file  Physical Activity: Not on file  Stress: Not on file  Social Connections: Not on file   Family History  Problem Relation Age of Onset  . Hypertension Mother   . Hypertension Father   . Mental illness Father   . Hypertension Maternal Grandmother   . Hypertension Paternal Grandmother   . Hypertension Paternal Grandfather   . Diabetes Paternal Grandfather   . Brain cancer Maternal Aunt   . Colon cancer Maternal Aunt   . Pancreatic cancer Neg Hx   . Esophageal cancer Neg Hx    Past Surgical History:  Procedure Laterality Date  . COLONOSCOPY    . ESOPHAGOGASTRODUODENOSCOPY ENDOSCOPY        Mardella Layman, MD 12/08/20 1617    Mardella Layman, MD 12/08/20 [phone]

## 2020-12-08 NOTE — Discharge Instructions (Addendum)
If not allergic, you may use over the counter ibuprofen or acetaminophen as needed. ° °

## 2020-12-12 ENCOUNTER — Encounter (HOSPITAL_COMMUNITY): Payer: Self-pay | Admitting: Emergency Medicine

## 2020-12-12 ENCOUNTER — Emergency Department (HOSPITAL_COMMUNITY)
Admission: EM | Admit: 2020-12-12 | Discharge: 2020-12-12 | Disposition: A | Discharge status: Home / Self Care | Payer: Medicaid Other | Attending: Emergency Medicine | Admitting: Emergency Medicine

## 2020-12-12 ENCOUNTER — Emergency Department (HOSPITAL_COMMUNITY): Payer: Medicaid Other

## 2020-12-12 ENCOUNTER — Other Ambulatory Visit: Payer: Self-pay

## 2020-12-12 DIAGNOSIS — K219 Gastro-esophageal reflux disease without esophagitis: Secondary | ICD-10-CM | POA: Diagnosis not present

## 2020-12-12 DIAGNOSIS — Z7722 Contact with and (suspected) exposure to environmental tobacco smoke (acute) (chronic): Secondary | ICD-10-CM | POA: Insufficient documentation

## 2020-12-12 DIAGNOSIS — Z8719 Personal history of other diseases of the digestive system: Secondary | ICD-10-CM | POA: Diagnosis not present

## 2020-12-12 DIAGNOSIS — R1031 Right lower quadrant pain: Secondary | ICD-10-CM

## 2020-12-12 DIAGNOSIS — R52 Pain, unspecified: Secondary | ICD-10-CM

## 2020-12-12 LAB — LIPASE, BLOOD: Lipase: 25 U/L (ref 11–51)

## 2020-12-12 LAB — URINALYSIS, ROUTINE W REFLEX MICROSCOPIC
Bilirubin Urine: NEGATIVE
Glucose, UA: NEGATIVE mg/dL
Hgb urine dipstick: NEGATIVE
Ketones, ur: NEGATIVE mg/dL
Leukocytes,Ua: NEGATIVE
Nitrite: NEGATIVE
Protein, ur: NEGATIVE mg/dL
Specific Gravity, Urine: 1.017 (ref 1.005–1.030)
pH: 7 (ref 5.0–8.0)

## 2020-12-12 LAB — CBC WITH DIFFERENTIAL/PLATELET
Abs Immature Granulocytes: 0.02 10*3/uL (ref 0.00–0.07)
Basophils Absolute: 0.1 10*3/uL (ref 0.0–0.1)
Basophils Relative: 1 %
Eosinophils Absolute: 0.1 10*3/uL (ref 0.0–0.5)
Eosinophils Relative: 2 %
HCT: 42 % (ref 36.0–46.0)
Hemoglobin: 13.6 g/dL (ref 12.0–15.0)
Immature Granulocytes: 0 %
Lymphocytes Relative: 47 %
Lymphs Abs: 3.6 10*3/uL (ref 0.7–4.0)
MCH: 28 pg (ref 26.0–34.0)
MCHC: 32.4 g/dL (ref 30.0–36.0)
MCV: 86.6 fL (ref 80.0–100.0)
Monocytes Absolute: 0.6 10*3/uL (ref 0.1–1.0)
Monocytes Relative: 8 %
Neutro Abs: 3.1 10*3/uL (ref 1.7–7.7)
Neutrophils Relative %: 42 %
Platelets: 391 10*3/uL (ref 150–400)
RBC: 4.85 MIL/uL (ref 3.87–5.11)
RDW: 14.3 % (ref 11.5–15.5)
WBC: 7.5 10*3/uL (ref 4.0–10.5)
nRBC: 0 % (ref 0.0–0.2)

## 2020-12-12 LAB — COMPREHENSIVE METABOLIC PANEL
ALT: 20 U/L (ref 0–44)
AST: 21 U/L (ref 15–41)
Albumin: 4.1 g/dL (ref 3.5–5.0)
Alkaline Phosphatase: 86 U/L (ref 38–126)
Anion gap: 6 (ref 5–15)
BUN: 11 mg/dL (ref 6–20)
CO2: 30 mmol/L (ref 22–32)
Calcium: 10 mg/dL (ref 8.9–10.3)
Chloride: 104 mmol/L (ref 98–111)
Creatinine, Ser: 0.77 mg/dL (ref 0.44–1.00)
GFR, Estimated: >60 mL/min (ref >60)
Glucose, Bld: 104 mg/dL — ABNORMAL HIGH (ref 70–99)
Potassium: 4.5 mmol/L (ref 3.5–5.1)
Sodium: 140 mmol/L (ref 135–145)
Total Bilirubin: 0.4 mg/dL (ref 0.3–1.2)
Total Protein: 7.8 g/dL (ref 6.5–8.1)

## 2020-12-12 LAB — I-STAT BETA HCG BLOOD, ED (MC, WL, AP ONLY): I-stat hCG, quantitative: <5 m[IU]/mL (ref <5)

## 2020-12-12 MED ORDER — IOHEXOL 9 MG/ML PO SOLN
ORAL | Status: AC
  Filled 2020-12-12: qty 1000

## 2020-12-12 MED ORDER — ONDANSETRON HCL 4 MG/2ML IJ SOLN
4.0000 mg | Freq: Once | INTRAMUSCULAR | Status: AC
  Administered 2020-12-12: 4 mg via INTRAVENOUS
  Filled 2020-12-12: qty 2

## 2020-12-12 MED ORDER — ONDANSETRON HCL 4 MG/2ML IJ SOLN: 4.0000 mg | Freq: Once | INTRAMUSCULAR | Status: DC

## 2020-12-12 MED ORDER — MORPHINE SULFATE (PF) 4 MG/ML IV SOLN
4.0000 mg | Freq: Once | INTRAVENOUS | Status: AC
Start: 2020-12-12 — End: 2020-12-12
  Administered 2020-12-12: 4 mg via INTRAVENOUS
  Filled 2020-12-12: qty 1

## 2020-12-12 MED ORDER — IOHEXOL 9 MG/ML PO SOLN: 500.0000 mL | ORAL | Status: AC

## 2020-12-12 MED ORDER — KETOROLAC TROMETHAMINE 30 MG/ML IJ SOLN
30.0000 mg | Freq: Once | INTRAMUSCULAR | Status: AC
  Administered 2020-12-12: 30 mg via INTRAMUSCULAR
  Filled 2020-12-12: qty 1

## 2020-12-12 MED ORDER — DICYCLOMINE HCL 20 MG PO TABS
20.0000 mg | ORAL_TABLET | Freq: Once | ORAL | Status: DC
  Filled 2020-12-12: qty 1

## 2020-12-12 MED ORDER — KETOROLAC TROMETHAMINE 30 MG/ML IJ SOLN
30.0000 mg | Freq: Once | INTRAMUSCULAR | Status: DC
  Filled 2020-12-12: qty 1

## 2020-12-12 MED ORDER — ONDANSETRON HCL 4 MG PO TABS: 4.0000 mg | ORAL_TABLET | Freq: Four times a day (QID) | ORAL | 0 refills | Status: DC

## 2020-12-12 NOTE — ED Provider Notes (Signed)
[facility]-EMERGENCY DEPT Provider Note   CSN: [id number] Arrival date & time: 12/12/20  1139     History No chief complaint on file.   Jamie Lawrence is a 20 y.o. female.  HPI   Patient presents with RLQ that started at 5 am this morning. It has been constant, stabbing and radiates to her back. States it is a 10/10. It is worsened by "everything". She has not tried any alleviating medications. This has happened one other time a few months ago. She was seen in the ED at that time and the source was not determined. She is not having vomiting, diarrhea, constipation, vomiting, fevers, chills, dysuria or hematuria.   LMP was May 2-6. She has a history of ovarian cysts.   Past Medical History:  Diagnosis Date  . Anxiety   . Bipolar depression (HCC)   . Chlamydia infection affecting pregnancy 07/06/2017   tx on 07/04/17  . Depression   . Eating disorder   . GERD (gastroesophageal reflux disease)   . Hypertension in pregnancy, preeclampsia, severe, delivered 07/05/2017  . Hypomania (HCC)   . IBS (irritable bowel syndrome)   . Insomnia   . Migraines   . Miscarriage March 2016  . Mononucleosis   . Mood disturbance   . 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 Vomiting     Patient Active Problem List   Diagnosis Date Noted  . Lower urinary tract infectious disease 07/29/2019  . Chlamydia infection 08/23/2017  . Depression   . Panic attacks 07/07/2017  . 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 GAD (generalized anxiety disorder) 12/08/2014  . Bulimia nervosa 12/08/2014    Past Surgical History:  Procedure Laterality Date  . COLONOSCOPY    . ESOPHAGOGASTRODUODENOSCOPY ENDOSCOPY       OB History    Gravida  2   Para  1   Term  0   Preterm  1   AB  1   Living  1     SAB  1   IAB  0   Ectopic  0   Multiple  0   Live Births  1           Family History  Problem  Relation Age of Onset  . Hypertension Mother   . Hypertension Father   . Mental illness Father   . Hypertension Maternal Grandmother   . Hypertension Paternal Grandmother   . Hypertension Paternal Grandfather   . Diabetes Paternal Grandfather   . Brain cancer Maternal Aunt   . Colon cancer Maternal Aunt   . Pancreatic cancer Neg Hx   . Esophageal cancer Neg Hx     Social History   Tobacco Use  . Smoking status: Passive Smoke Exposure - Never Smoker  . Smokeless tobacco: Never Used  Vaping Use  . Vaping Use: Never used  Substance Use Topics  . Alcohol use: No    Alcohol/week: 0.0 standard drinks  . Drug use: No    Home Medications Prior to Admission medications   Medication Sig Start Date End Date Taking? Authorizing Provider  nitrofurantoin, macrocrystal-monohydrate, (MACROBID) 100 MG capsule Take 1 capsule (100 mg total) by mouth 2 (two) times daily. 09/07/20   Merrilee Jansky, MD  aspirin-acetaminophen-caffeine (EXCEDRIN MIGRAINE) [phone] MG tablet Take 1 tablet by mouth every 6 (six) hours as needed for headache. 08/17/20   Rhys Martini, PA-C  methocarbamol (ROBAXIN) 500 MG tablet Take 1 tablet (  500 mg total) by mouth 2 (two) times daily. 09/11/20   Khatri, Hina, PA-C  ondansetron (ZOFRAN) 4 MG tablet Take 1 tablet (4 mg total) by mouth every 8 (eight) hours as needed for nausea or vomiting. 08/23/20   Fayrene Helper, PA-C  famotidine (PEPCID) 20 MG tablet Take 1 tablet (20 mg total) by mouth daily. 07/13/20 08/17/20  Caccavale, Sophia, PA-C  fluticasone (FLONASE) 50 MCG/ACT nasal spray Place 1 spray into both nostrils daily. 03/28/20 08/17/20  Dahlia Byes A, NP  omeprazole (PRILOSEC) 20 MG capsule Take 1 capsule (20 mg total) by mouth daily. 06/14/20 08/17/20  Unk Lightning, PA    Allergies    Patient has no known allergies.  Review of Systems   Review of Systems  Constitutional: Negative for chills and fever.  HENT: Negative for ear pain and sore throat.    Eyes: Negative for pain and visual disturbance.  Respiratory: Negative for cough and shortness of breath.   Cardiovascular: Negative for chest pain and palpitations.  Gastrointestinal: Positive for abdominal pain. Negative for diarrhea, nausea and vomiting.  Genitourinary: Negative for dysuria and hematuria.  Musculoskeletal: Negative for arthralgias and back pain.  Skin: Negative for color change and rash.  Neurological: Negative for seizures and syncope.  All other systems reviewed and are negative.   Physical Exam Updated Vital Signs BP 137/81 (BP Location: Right Arm)   Pulse 95   Temp 99.6 F (37.6 C) (Oral)   Resp 16   Ht 4\' 11"  (1.499 m)   Wt 63.5 kg   LMP 11/22/2020   SpO2 100%   BMI 28.28 kg/m   Physical Exam Vitals and nursing note reviewed. Exam conducted with a chaperone present.  Constitutional:      Appearance: Normal appearance.  HENT:     Head: Normocephalic and atraumatic.  Eyes:     General: No scleral icterus.       Right eye: No discharge.        Left eye: No discharge.     Extraocular Movements: Extraocular movements intact.     Pupils: Pupils are equal, round, and reactive to light.  Cardiovascular:     Rate and Rhythm: Normal rate and regular rhythm.     Pulses: Normal pulses.     Heart sounds: Normal heart sounds. No murmur heard. No friction rub. No gallop.   Pulmonary:     Effort: Pulmonary effort is normal. No respiratory distress.     Breath sounds: Normal breath sounds.  Abdominal:     General: Abdomen is flat. Bowel sounds are normal. There is no distension.     Palpations: Abdomen is soft.     Tenderness: There is abdominal tenderness. There is no right CVA tenderness or left CVA tenderness.     Comments: RLQ and right pelvis TTP  Skin:    General: Skin is warm and dry.     Coloration: Skin is not jaundiced.  Neurological:     Mental Status: She is alert. Mental status is at baseline.     Coordination: Coordination normal.      ED Results / Procedures / Treatments   Labs (all labs ordered are listed, but only abnormal results are displayed) Labs Reviewed  COMPREHENSIVE METABOLIC PANEL - Abnormal; Notable for the following components:      Result Value   Glucose, Bld 104 (*)    All other components within normal limits  CBC WITH DIFFERENTIAL/PLATELET  LIPASE, BLOOD  URINALYSIS, ROUTINE W REFLEX MICROSCOPIC  I-STAT BETA HCG BLOOD, ED ([facility], [facility], [facility] ONLY)    EKG None  Radiology No results found.  Procedures Procedures   Medications Ordered in ED Medications - No data to display  ED Course  I have reviewed the triage vital signs and the nursing notes.  Pertinent labs & imaging results that were available during my care of the patient were reviewed by me and considered in my medical decision making (see chart for details).  Clinical Course as of 12/12/20 1840  Sun Dec 12, 2020  1837 US PELVIC COMPLETE W TRANSVAGINAL AND TORSION R/O [HS]    Clinical Course User Index [HS] Theron Arista, PA-C   MDM Rules/Calculators/A&P                          Patient is a 20 year old female with RLQ and right pelvis pain x 5 am. Vitals stable, nontoxic appearing. History of same complaint 09/05/2020. PE showed TTP to RLQ without peritoneal signs. No dysuria, hematuria, CVAT.   Given age and location of the pain, have a suspicion for appendicitis. Also on the differential is ovarian torsion, ectopic pregnancy, ovarian cyst, T-O abscess, cholecystitis, pancreatitis, UTI, pyelo, kidney stone  Labs:  UA: No signs of UTI  CBC: No elevated WBC suggesting infection, no anemia  Lipase: not concerning for pancreatitis   I-Stat beta hCG: not ectopic pregnancy  CMP: No electrolyte derangement   Imaging:  US Pelvic: No ovarian cysts, signs of current torsion, or abscess.   CT Abdomen Pelvis:   A/P:  Patient labs are reassuring and imaging are without findings of emergent disease. Patient vitals are stable, and her  pain has improved during her course. She is agreeable to discharge with follow up to OB-GYN. Referral was made. Return precautions given.   Final Clinical Impression(s) / ED Diagnoses Final diagnoses:  None    Rx / DC Orders ED Discharge Orders    None       Theron Arista, Cordelia Poche 12/12/20 1843    Charlynne Pander, MD 12/14/20 [phone]

## 2020-12-12 NOTE — ED Provider Notes (Signed)
Emergency Medicine Provider Triage Evaluation Note  Jamie Lawrence , a 20 y.o. female  was evaluated in triage.  Pt complains of RLQ pain onset this morning, woke from sleep, constant, stabbing in nature, moves to right lower back, worse with breathing/movement, not associated with nausea or vomiting. Similar pain previously without source identified, a few months ago, seen here.  Review of Systems  Positive: Abdominal pain, back pain Negative: Changes in bowel or bladder habits, n/v, vaginal dc.  Physical Exam  BP 137/81 (BP Location: Right Arm)   Pulse 95   Temp 99.6 F (37.6 C) (Oral)   Resp 16   Ht 4\' 11"  (1.499 m)   Wt 63.5 kg   LMP 11/22/2020   SpO2 100%   BMI 28.28 kg/m  Gen:   Awake, no distress   Resp:  Normal effort  MSK:   Moves extremities without difficulty  Other:  Diffuse abdominal tenderness, back pain (reports chronic back pain from muscle spasms)  Medical Decision Making  Medically screening exam initiated at 12:11 PM.  Appropriate orders placed.  01/22/2021 was informed that the remainder of the evaluation will be completed by another provider, this initial triage assessment does not replace that evaluation, and the importance of remaining in the ED until their evaluation is complete.     Jamie Constable, PA-C 12/12/20 1214    12/14/20, MD 12/12/20 [phone]

## 2020-12-12 NOTE — Discharge Instructions (Addendum)
You were seen today for right lower abdominal pain. We did not find anything concerning on your pelvic ultrasound or in your blood work. Please call to schedule an appointment with [facility] physicians as soon as possible so that they continue to workup the cause of your pain. Take tylenol and motrin as needed for the pain. Take the zofran every 6 hours as needed for nausea. If you develop a high fever, uncontrollable vomiting, or difficulty breathing please return to the ED.

## 2020-12-12 NOTE — ED Triage Notes (Signed)
Patient complains of RLQ abdominal pain that wraps around to her back since 0500 this morning, denies N/V/D or fevers. Had a regular BM this morning, constant pain persists.

## 2021-03-09 ENCOUNTER — Ambulatory Visit (HOSPITAL_COMMUNITY)
Admission: EM | Admit: 2021-03-09 | Discharge: 2021-03-09 | Disposition: A | Discharge status: Home / Self Care | Payer: Medicaid Other | Attending: Emergency Medicine | Admitting: Emergency Medicine

## 2021-03-09 ENCOUNTER — Encounter (HOSPITAL_COMMUNITY): Payer: Self-pay

## 2021-03-09 ENCOUNTER — Other Ambulatory Visit: Payer: Self-pay

## 2021-03-09 DIAGNOSIS — T148XXA Other injury of unspecified body region, initial encounter: Secondary | ICD-10-CM | POA: Diagnosis not present

## 2021-03-09 DIAGNOSIS — G8929 Other chronic pain: Secondary | ICD-10-CM | POA: Diagnosis not present

## 2021-03-09 DIAGNOSIS — M545 Low back pain, unspecified: Secondary | ICD-10-CM

## 2021-03-09 MED ORDER — PREDNISONE 10 MG (21) PO TBPK: ORAL_TABLET | Freq: Every day | ORAL | 0 refills | Status: DC

## 2021-03-09 MED ORDER — MELOXICAM 7.5 MG PO TABS
7.5000 mg | ORAL_TABLET | Freq: Every day | ORAL | 0 refills | Status: DC
Start: 2021-03-09 — End: 2022-05-19

## 2021-03-09 NOTE — Discharge Instructions (Addendum)
Follow up with the spine doctor for further test  Take the round of steriods since it did help last time May use a heating pad for pain  Take pain meds as needed  Can use NSAIDS such a motrin as needed

## 2021-03-09 NOTE — ED Triage Notes (Signed)
Pt reports middle and lower back pain since this morning. States back pain started when she was laying in bed and tried to reach to her phone.

## 2021-03-09 NOTE — ED Provider Notes (Signed)
[facility]    CSN: [id number] Arrival date & time: 03/09/21  1036      History   Chief Complaint Chief Complaint  Patient presents with   Back Pain    HPI Jamie Lawrence is a 20 y.o. female.   Pt has chronic lower back pain was seen previously for this and given muscle relaxer with no relief. Was seen at the spine center and they gave steroids with some relief. Reached for her phone with am with a twisting motion and began to have the pain again. Pain is more midspine and radiates to lower back. No loss of urine. Walked into room. 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   Vomiting     Patient Active Problem List   Diagnosis Date Noted   Lower urinary tract infectious disease 07/29/2019   Chlamydia infection 08/23/2017   Depression    Panic attacks 07/07/2017   MDD (major depressive disorder), single episode, severe , no psychosis (HCC) 12/08/2014   GAD (generalized anxiety disorder) 12/08/2014   Bulimia nervosa 12/08/2014    Past Surgical History:  Procedure Laterality Date   COLONOSCOPY     ESOPHAGOGASTRODUODENOSCOPY ENDOSCOPY      OB History     Gravida  2   Para  1   Term  0   Preterm  1   AB  1   Living  1      SAB  1   IAB  0   Ectopic  0   Multiple  0   Live Births  1            Home Medications    Prior to Admission medications   Medication Sig Start Date End Date Taking? Authorizing Provider  meloxicam (MOBIC) 7.5 MG tablet Take 1  tablet (7.5 mg total) by mouth daily. 03/09/21  Yes Coralyn Mark, NP  nitrofurantoin, macrocrystal-monohydrate, (MACROBID) 100 MG capsule Take 1 capsule (100 mg total) by mouth 2 (two) times daily. 09/07/20   Lamptey, Britta Mccreedy, MD  predniSONE (STERAPRED UNI-PAK 21 TAB) 10 MG (21) TBPK tablet Take by mouth daily. Take 6 tabs by mouth daily  for 2 days, then 5 tabs for 2 days, then 4 tabs for 2 days, then 3 tabs for 2 days, 2 tabs for 2 days, then 1 tab by mouth daily for 2 days 03/09/21  Yes Coralyn Mark, NP  aspirin-acetaminophen-caffeine (EXCEDRIN MIGRAINE) [phone] MG tablet Take 1 tablet by mouth every 6 (six) hours as needed for headache. 08/17/20   Rhys Martini, PA-C  methocarbamol (ROBAXIN) 500 MG tablet Take 1 tablet (500 mg total) by mouth 2 (two) times daily. 09/11/20   Khatri, Hina, PA-C  ondansetron (ZOFRAN) 4 MG tablet Take 1 tablet (4 mg total) by mouth every 6 (six) hours. 12/12/20   Theron Arista, PA-C  famotidine (PEPCID) 20 MG tablet Take 1 tablet (20 mg total) by mouth daily. 07/13/20  08/17/20  Caccavale, Sophia, PA-C  fluticasone (FLONASE) 50 MCG/ACT nasal spray Place 1 spray into both nostrils daily. 03/28/20 08/17/20  Dahlia Byes A, NP  omeprazole (PRILOSEC) 20 MG capsule Take 1 capsule (20 mg total) by mouth daily. 06/14/20 08/17/20  Unk Lightning, PA    Family History Family History  Problem Relation Age of Onset   Hypertension Mother    Hypertension Father    Mental illness Father    Hypertension Maternal Grandmother    Hypertension Paternal Grandmother    Hypertension Paternal Grandfather    Diabetes Paternal Grandfather    Brain cancer Maternal Aunt    Colon cancer Maternal Aunt    Pancreatic cancer Neg Hx    Esophageal cancer Neg Hx     Social History Social History   Tobacco Use   Smoking status: Passive Smoke Exposure - Never Smoker   Smokeless tobacco: Never  Vaping Use   Vaping Use: Never used  Substance Use Topics   Alcohol use: No     Alcohol/week: 0.0 standard drinks   Drug use: No     Allergies   Patient has no known allergies.   Review of Systems Review of Systems  Constitutional: Negative.   Respiratory: Negative.    Cardiovascular: Negative.   Gastrointestinal: Negative.   Genitourinary: Negative.   Musculoskeletal:  Positive for back pain.       Mid spine and lower back area   Skin: Negative.   Neurological: Negative.     Physical Exam Triage Vital Signs ED Triage Vitals  Enc Vitals Group     BP 03/09/21 1125 130/90     Pulse Rate 03/09/21 1125 90     Resp 03/09/21 1125 18     Temp 03/09/21 1125 98.6 F (37 C)     Temp Source 03/09/21 1125 Oral     SpO2 03/09/21 1125 100 %     Weight --      Height --      Head Circumference --      Peak Flow --      Pain Score 03/09/21 1123 10     Pain Loc --      Pain Edu? --      Excl. in GC? --    No data found.  Updated Vital Signs BP 130/90 (BP Location: Right Arm)   Pulse 90   Temp 98.6 F (37 C) (Oral)   Resp 18   LMP  (Within Days)   SpO2 100%   Visual Acuity Right Eye Distance:   Left Eye Distance:   Bilateral Distance:    Right Eye Near:   Left Eye Near:    Bilateral Near:     Physical Exam Constitutional:      Appearance: Normal appearance.  Cardiovascular:     Rate and Rhythm: Normal rate.  Pulmonary:     Effort: Pulmonary effort is normal.  Abdominal:     General: Abdomen is flat.  Musculoskeletal:        General: Tenderness present. No swelling.     Cervical back: Normal range of motion.     Comments: Tenderness with flexion to lumbar region radiates to lower back area, denies sciatica pain,   Skin:    General: Skin is warm.     Capillary Refill: Capillary refill takes less than 2 seconds.  Neurological:     General: No focal deficit present.     Mental Status: She is alert.     UC Treatments /  Results  Labs (all labs ordered are listed, but only abnormal results are displayed) Labs Reviewed - No data to  display  EKG   Radiology No results found.  Procedures Procedures (including critical care time)  Medications Ordered in UC Medications - No data to display  Initial Impression / Assessment and Plan / UC Course  I have reviewed the triage vital signs and the nursing notes.  Pertinent labs & imaging results that were available during my care of the patient were reviewed by me and considered in my medical decision making (see chart for details).     Follow up with the spine doctor for further test  Take the round of steriods since it did help last time May use a heating pad for pain  Take pain meds as needed  Can use NSAIDS such a motrin as needed  Final Clinical Impressions(s) / UC Diagnoses   Final diagnoses:  Chronic midline low back pain without sciatica  Muscle strain     Discharge Instructions      Follow up with the spine doctor for further test  Take the round of steriods since it did help last time May use a heating pad for pain  Take pain meds as needed  Can use NSAIDS such a motrin as needed      ED Prescriptions     Medication Sig Dispense Auth. Provider   predniSONE (STERAPRED UNI-PAK 21 TAB) 10 MG (21) TBPK tablet Take by mouth daily. Take 6 tabs by mouth daily  for 2 days, then 5 tabs for 2 days, then 4 tabs for 2 days, then 3 tabs for 2 days, 2 tabs for 2 days, then 1 tab by mouth daily for 2 days 42 tablet Maple Mirza L, NP   meloxicam (MOBIC) 7.5 MG tablet Take 1 tablet (7.5 mg total) by mouth daily. 14 tablet Coralyn Mark, NP      PDMP not reviewed this encounter.   Coralyn Mark, NP 03/09/21 1222

## 2021-04-02 ENCOUNTER — Ambulatory Visit (HOSPITAL_COMMUNITY)
Admission: EM | Admit: 2021-04-02 | Discharge: 2021-04-02 | Disposition: A | Discharge status: Home / Self Care | Payer: Medicaid Other | Attending: Emergency Medicine | Admitting: Emergency Medicine

## 2021-04-02 ENCOUNTER — Other Ambulatory Visit: Payer: Self-pay

## 2021-04-02 ENCOUNTER — Encounter (HOSPITAL_COMMUNITY): Payer: Self-pay | Admitting: Emergency Medicine

## 2021-04-02 DIAGNOSIS — M25561 Pain in right knee: Secondary | ICD-10-CM

## 2021-04-02 MED ORDER — PREDNISONE 20 MG PO TABS: 40.0000 mg | ORAL_TABLET | Freq: Every day | ORAL | 0 refills | Status: DC

## 2021-04-02 MED ORDER — METHOCARBAMOL 500 MG PO TABS: 500.0000 mg | ORAL_TABLET | Freq: Two times a day (BID) | ORAL | 0 refills | Status: DC

## 2021-04-02 NOTE — Discharge Instructions (Addendum)
Your pain is most likely caused by irritation to the muscles or ligaments.   Take prednisone every morning with food for 5 days, once complete use meloxicam for 5 days every morning with food then as needed  Can use muscle relaxer twice a day ad needed, be  mindful this medication may make you drowsy   You may use heating pad in 15 minute intervals as needed for additional comfort, within the first 2-3 days you may find comfort in using ice in 10-15 minutes over affected area  Begin stretching affected area daily for 10 minutes as tolerated to further loosen muscles   If pain persist after recommended treatment or reoccurs if may be beneficial to follow up with orthopedic specialist for evaluation, this doctor specializes in the bones and can manage your symptoms long-term with options such as but not limited to imaging, medications or physical therapy

## 2021-04-02 NOTE — ED Provider Notes (Signed)
[facility]    CSN: [id number] Arrival date & time: 04/02/21  1017      History   Chief Complaint Chief Complaint  Patient presents with   Knee Pain    Intermittent for 1.5 weeks, no injury    HPI JUNITA KUBOTA is a 20 y.o. female.   Patient presents with right knee pain for 1-1/2 weeks, starting abruptly with no precipitating event or injury.  Rating pain 10 out of 10, described as a burning sensation.  Can be felt with flexion and extension of leg.  Not necessarily worsened by any particular movement.  Able to bear weight onto the leg but painful.  Has attempted use of over-the-counter Tylenol, ibuprofen, lidocaine patches, muscle rub, meloxicam with no relief.   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   Vomiting     Patient Active Problem List   Diagnosis Date Noted   Lower urinary tract infectious disease 07/29/2019   Chlamydia infection 08/23/2017   Depression    Panic attacks 07/07/2017   MDD (major depressive disorder), single episode, severe , no psychosis (HCC) 12/08/2014   GAD (generalized anxiety disorder) 12/08/2014   Bulimia nervosa 12/08/2014    Past Surgical History:  Procedure Laterality Date   COLONOSCOPY     ESOPHAGOGASTRODUODENOSCOPY ENDOSCOPY      OB History     Gravida  2   Para  1   Term  0   Preterm  1   AB  1   Living  1      SAB  1   IAB  0   Ectopic  0   Multiple  0   Live Births  1            Home Medications    Prior to Admission medications    Medication Sig Start Date End Date Taking? Authorizing Provider  predniSONE (DELTASONE) 20 MG tablet Take 2 tablets (40 mg total) by mouth daily. 04/02/21  Yes Malene Blaydes R, NP  meloxicam (MOBIC) 7.5 MG tablet Take 1 tablet (7.5 mg total) by mouth daily. 03/09/21   Coralyn Mark, NP  methocarbamol (ROBAXIN) 500 MG tablet Take 1 tablet (500 mg total) by mouth 2 (two) times daily. 04/02/21   Kaylin Marcon, Elita Boone, NP  ondansetron (ZOFRAN) 4 MG tablet Take 1 tablet (4 mg total) by mouth every 6 (six) hours. 12/12/20   Theron Arista, PA-C  famotidine (PEPCID) 20 MG tablet Take 1 tablet (20 mg total) by mouth daily. 07/13/20 08/17/20  Caccavale, Sophia, PA-C  fluticasone (FLONASE) 50 MCG/ACT nasal spray Place 1 spray into both nostrils daily. 03/28/20 08/17/20  Dahlia Byes A, NP  omeprazole (PRILOSEC) 20 MG capsule Take 1 capsule (20 mg total) by mouth daily. 06/14/20 08/17/20  Unk Lightning, PA    Family History Family History  Problem Relation Age of Onset   Hypertension Mother    Hypertension Father    Mental illness Father    Hypertension Maternal Grandmother  Hypertension Paternal Grandmother    Hypertension Paternal Grandfather    Diabetes Paternal Grandfather    Brain cancer Maternal Aunt    Colon cancer Maternal Aunt    Pancreatic cancer Neg Hx    Esophageal cancer Neg Hx     Social History Social History   Tobacco Use   Smoking status: Passive Smoke Exposure - Never Smoker   Smokeless tobacco: Never  Vaping Use   Vaping Use: Never used  Substance Use Topics   Alcohol use: No    Alcohol/week: 0.0 standard drinks   Drug use: No     Allergies   Patient has no known allergies.   Review of Systems Review of Systems  Constitutional: Negative.   Respiratory: Negative.    Cardiovascular: Negative.   Skin: Negative.   Neurological: Negative.     Physical Exam Triage Vital Signs ED Triage Vitals [04/02/21 1141]  Enc Vitals Group     BP 122/84     Pulse  Rate 90     Resp 16     Temp 98.5 F (36.9 C)     Temp src      SpO2 99 %     Weight      Height      Head Circumference      Peak Flow      Pain Score      Pain Loc      Pain Edu?      Excl. in GC?    No data found.  Updated Vital Signs BP 122/84   Pulse 90   Temp 98.5 F (36.9 C)   Resp 16   LMP 03/31/2021   SpO2 99%   Visual Acuity Right Eye Distance:   Left Eye Distance:   Bilateral Distance:    Right Eye Near:   Left Eye Near:    Bilateral Near:     Physical Exam Constitutional:      Appearance: Normal appearance. She is normal weight.  HENT:     Head: Normocephalic.  Eyes:     Extraocular Movements: Extraocular movements intact.  Pulmonary:     Effort: Pulmonary effort is normal.  Musculoskeletal:     Comments: Diffuse tenderness present throughout anterior and posterior left knee, no point tenderness noted, no erythema, ecchymoses, effusion, swelling noted, range of motion intact, 2+ popliteal pulse, no ligament laxity noted  Skin:    General: Skin is warm and dry.  Neurological:     Mental Status: She is alert and oriented to person, place, and time. Mental status is at baseline.  Psychiatric:        Mood and Affect: Mood normal.        Behavior: Behavior normal.     UC Treatments / Results  Labs (all labs ordered are listed, but only abnormal results are displayed) Labs Reviewed - No data to display  EKG   Radiology No results found.  Procedures Procedures (including critical care time)  Medications Ordered in UC Medications - No data to display  Initial Impression / Assessment and Plan / UC Course  I have reviewed the triage vital signs and the nursing notes.  Pertinent labs & imaging results that were available during my care of the patient were reviewed by me and considered in my medical decision making (see chart for details).  Acute pain of right knee  Exam is overall benign with no focal point of tenderness, will defer  x-ray at this time and manage conservatively, discussed  with patient, in agreement with plan of care, patient to follow-up with orthopedic specialist, walking referral given for persistent or reoccurring pain  1.  Prednisone 40 mg daily for 5 days 2.  Once complete meloxicam 7.5 mg to be used for 5 days then as needed, patient already has medication at home 3.  Robaxin 500 mg twice daily as needed for additional comfort 4.  Heating pad or ice in 15-minute intervals for additional comfort 5.  Daily stretching as tolerated Final Clinical Impressions(s) / UC Diagnoses   Final diagnoses:  Acute pain of right knee     Discharge Instructions      Your pain is most likely caused by irritation to the muscles or ligaments.   Take prednisone every morning with food for 5 days, once complete use meloxicam for 5 days every morning with food then as needed  Can use muscle relaxer twice a day ad needed, be  mindful this medication may make you drowsy   You may use heating pad in 15 minute intervals as needed for additional comfort, within the first 2-3 days you may find comfort in using ice in 10-15 minutes over affected area  Begin stretching affected area daily for 10 minutes as tolerated to further loosen muscles   If pain persist after recommended treatment or reoccurs if may be beneficial to follow up with orthopedic specialist for evaluation, this doctor specializes in the bones and can manage your symptoms long-term with options such as but not limited to imaging, medications or physical therapy      ED Prescriptions     Medication Sig Dispense Auth. Provider   predniSONE (DELTASONE) 20 MG tablet Take 2 tablets (40 mg total) by mouth daily. 10 tablet Kema Santaella R, NP   methocarbamol (ROBAXIN) 500 MG tablet Take 1 tablet (500 mg total) by mouth 2 (two) times daily. 20 tablet Valinda Hoar, NP      PDMP not reviewed this encounter.   Valinda Hoar, [place] 04/02/21 1202

## 2021-04-02 NOTE — ED Triage Notes (Signed)
Pt reports right knee pain for 1.5 weeks. States pain is intermittent, no injury. Today pain is 10/10, ambulated from waiting room with no gait change.

## 2021-06-23 DIAGNOSIS — Z419 Encounter for procedure for purposes other than remedying health state, unspecified: Secondary | ICD-10-CM | POA: Diagnosis not present

## 2021-07-24 DIAGNOSIS — Z419 Encounter for procedure for purposes other than remedying health state, unspecified: Secondary | ICD-10-CM | POA: Diagnosis not present

## 2021-08-17 DIAGNOSIS — M5441 Lumbago with sciatica, right side: Secondary | ICD-10-CM | POA: Diagnosis not present

## 2021-08-24 DIAGNOSIS — Z419 Encounter for procedure for purposes other than remedying health state, unspecified: Secondary | ICD-10-CM | POA: Diagnosis not present

## 2021-09-06 DIAGNOSIS — M5442 Lumbago with sciatica, left side: Secondary | ICD-10-CM | POA: Diagnosis not present

## 2021-09-06 DIAGNOSIS — M6281 Muscle weakness (generalized): Secondary | ICD-10-CM | POA: Diagnosis not present

## 2021-09-06 DIAGNOSIS — M5386 Other specified dorsopathies, lumbar region: Secondary | ICD-10-CM | POA: Diagnosis not present

## 2021-09-06 DIAGNOSIS — R293 Abnormal posture: Secondary | ICD-10-CM | POA: Diagnosis not present

## 2021-09-06 DIAGNOSIS — M5441 Lumbago with sciatica, right side: Secondary | ICD-10-CM | POA: Diagnosis not present

## 2021-09-07 DIAGNOSIS — M6281 Muscle weakness (generalized): Secondary | ICD-10-CM | POA: Diagnosis not present

## 2021-09-07 DIAGNOSIS — M5441 Lumbago with sciatica, right side: Secondary | ICD-10-CM | POA: Diagnosis not present

## 2021-09-07 DIAGNOSIS — R202 Paresthesia of skin: Secondary | ICD-10-CM | POA: Diagnosis not present

## 2021-09-07 DIAGNOSIS — R29898 Other symptoms and signs involving the musculoskeletal system: Secondary | ICD-10-CM | POA: Diagnosis not present

## 2021-09-07 DIAGNOSIS — M5416 Radiculopathy, lumbar region: Secondary | ICD-10-CM | POA: Diagnosis not present

## 2021-09-07 DIAGNOSIS — R269 Unspecified abnormalities of gait and mobility: Secondary | ICD-10-CM | POA: Diagnosis not present

## 2021-09-07 DIAGNOSIS — R6889 Other general symptoms and signs: Secondary | ICD-10-CM | POA: Diagnosis not present

## 2021-09-13 DIAGNOSIS — M6281 Muscle weakness (generalized): Secondary | ICD-10-CM | POA: Diagnosis not present

## 2021-09-13 DIAGNOSIS — M5416 Radiculopathy, lumbar region: Secondary | ICD-10-CM | POA: Diagnosis not present

## 2021-09-13 DIAGNOSIS — R6889 Other general symptoms and signs: Secondary | ICD-10-CM | POA: Diagnosis not present

## 2021-09-13 DIAGNOSIS — R29898 Other symptoms and signs involving the musculoskeletal system: Secondary | ICD-10-CM | POA: Diagnosis not present

## 2021-09-13 DIAGNOSIS — R269 Unspecified abnormalities of gait and mobility: Secondary | ICD-10-CM | POA: Diagnosis not present

## 2021-09-13 DIAGNOSIS — R202 Paresthesia of skin: Secondary | ICD-10-CM | POA: Diagnosis not present

## 2021-09-13 DIAGNOSIS — M5441 Lumbago with sciatica, right side: Secondary | ICD-10-CM | POA: Diagnosis not present

## 2021-09-14 DIAGNOSIS — Z23 Encounter for immunization: Secondary | ICD-10-CM | POA: Diagnosis not present

## 2021-09-14 DIAGNOSIS — F419 Anxiety disorder, unspecified: Secondary | ICD-10-CM | POA: Diagnosis not present

## 2021-09-14 DIAGNOSIS — Z124 Encounter for screening for malignant neoplasm of cervix: Secondary | ICD-10-CM | POA: Diagnosis not present

## 2021-09-14 DIAGNOSIS — Z113 Encounter for screening for infections with a predominantly sexual mode of transmission: Secondary | ICD-10-CM | POA: Diagnosis not present

## 2021-09-14 DIAGNOSIS — Z0000 Encounter for general adult medical examination without abnormal findings: Secondary | ICD-10-CM | POA: Diagnosis not present

## 2021-09-14 DIAGNOSIS — M5441 Lumbago with sciatica, right side: Secondary | ICD-10-CM | POA: Diagnosis not present

## 2021-09-14 DIAGNOSIS — M5442 Lumbago with sciatica, left side: Secondary | ICD-10-CM | POA: Diagnosis not present

## 2021-09-15 DIAGNOSIS — R202 Paresthesia of skin: Secondary | ICD-10-CM | POA: Diagnosis not present

## 2021-09-15 DIAGNOSIS — R29898 Other symptoms and signs involving the musculoskeletal system: Secondary | ICD-10-CM | POA: Diagnosis not present

## 2021-09-15 DIAGNOSIS — R269 Unspecified abnormalities of gait and mobility: Secondary | ICD-10-CM | POA: Diagnosis not present

## 2021-09-15 DIAGNOSIS — M6281 Muscle weakness (generalized): Secondary | ICD-10-CM | POA: Diagnosis not present

## 2021-09-15 DIAGNOSIS — M5441 Lumbago with sciatica, right side: Secondary | ICD-10-CM | POA: Diagnosis not present

## 2021-09-15 DIAGNOSIS — M5416 Radiculopathy, lumbar region: Secondary | ICD-10-CM | POA: Diagnosis not present

## 2021-09-15 DIAGNOSIS — R6889 Other general symptoms and signs: Secondary | ICD-10-CM | POA: Diagnosis not present

## 2021-09-20 DIAGNOSIS — R6889 Other general symptoms and signs: Secondary | ICD-10-CM | POA: Diagnosis not present

## 2021-09-20 DIAGNOSIS — M5441 Lumbago with sciatica, right side: Secondary | ICD-10-CM | POA: Diagnosis not present

## 2021-09-20 DIAGNOSIS — R202 Paresthesia of skin: Secondary | ICD-10-CM | POA: Diagnosis not present

## 2021-09-20 DIAGNOSIS — M5416 Radiculopathy, lumbar region: Secondary | ICD-10-CM | POA: Diagnosis not present

## 2021-09-20 DIAGNOSIS — R29898 Other symptoms and signs involving the musculoskeletal system: Secondary | ICD-10-CM | POA: Diagnosis not present

## 2021-09-20 DIAGNOSIS — M6281 Muscle weakness (generalized): Secondary | ICD-10-CM | POA: Diagnosis not present

## 2021-09-20 DIAGNOSIS — R269 Unspecified abnormalities of gait and mobility: Secondary | ICD-10-CM | POA: Diagnosis not present

## 2021-09-21 DIAGNOSIS — Z419 Encounter for procedure for purposes other than remedying health state, unspecified: Secondary | ICD-10-CM | POA: Diagnosis not present

## 2021-10-04 DIAGNOSIS — Z113 Encounter for screening for infections with a predominantly sexual mode of transmission: Secondary | ICD-10-CM | POA: Diagnosis not present

## 2021-10-04 DIAGNOSIS — A749 Chlamydial infection, unspecified: Secondary | ICD-10-CM | POA: Diagnosis not present

## 2021-10-22 DIAGNOSIS — Z419 Encounter for procedure for purposes other than remedying health state, unspecified: Secondary | ICD-10-CM | POA: Diagnosis not present

## 2021-11-18 ENCOUNTER — Encounter (HOSPITAL_COMMUNITY): Payer: Self-pay | Admitting: Emergency Medicine

## 2021-11-18 ENCOUNTER — Other Ambulatory Visit: Payer: Self-pay

## 2021-11-18 ENCOUNTER — Ambulatory Visit (HOSPITAL_COMMUNITY)
Admission: EM | Admit: 2021-11-18 | Discharge: 2021-11-18 | Disposition: A | Discharge status: Home / Self Care | Payer: Medicaid Other | Attending: Emergency Medicine | Admitting: Emergency Medicine

## 2021-11-18 DIAGNOSIS — J020 Streptococcal pharyngitis: Secondary | ICD-10-CM

## 2021-11-18 DIAGNOSIS — H9201 Otalgia, right ear: Secondary | ICD-10-CM | POA: Diagnosis not present

## 2021-11-18 LAB — POCT RAPID STREP A, ED / UC: Streptococcus, Group A Screen (Direct): POSITIVE — AB

## 2021-11-18 MED ORDER — KETOROLAC TROMETHAMINE 30 MG/ML IJ SOLN
INTRAMUSCULAR | Status: AC
  Filled 2021-11-18: qty 1

## 2021-11-18 MED ORDER — AMOXICILLIN 500 MG PO CAPS: 500.0000 mg | ORAL_CAPSULE | Freq: Two times a day (BID) | ORAL | 0 refills | Status: AC

## 2021-11-18 MED ORDER — ACETAMINOPHEN 325 MG PO TABS
ORAL_TABLET | ORAL | Status: AC
  Filled 2021-11-18: qty 2

## 2021-11-18 MED ORDER — ACETAMINOPHEN 325 MG PO TABS
650.0000 mg | ORAL_TABLET | Freq: Once | ORAL | Status: AC
  Administered 2021-11-18: 650 mg via ORAL

## 2021-11-18 NOTE — ED Triage Notes (Signed)
Pt reports right side throat pain and right ear pain since 5 am this morning.  ?

## 2021-11-18 NOTE — ED Provider Notes (Signed)
?[facility] ? ? ? ?CSN: [id number] ?Arrival date & time: 11/18/21  9629 ? ?  ? ?History   ?Chief Complaint ?Chief Complaint  ?Patient presents with  ? Sore Throat  ? Otalgia  ? ? ?HPI ?Jamie Lawrence is a 21 y.o. female.  ?She presents today with sore throat and ear pain that began at 5 am. She reports 10/10 pain on the right side of her throat. She has some pain with swallowing food and water. She took a dose of ibuprofen this morning with no relief from pain. She reports she had strep throat last year that felt the same as this episode. She denies trouble breathing, drooling, fever, headache, cough, rash, nausea/vomiting/diarrhea. ?The ear pain began around 8 am. She has no difficulty hearing and no drainage from the ear. ? ?No known sick contacts. She is up to date on all her vaccinations.  ? ?Past Medical History:  ?Diagnosis Date  ? Anxiety   ? Bipolar depression (HCC)   ? Chlamydia infection affecting pregnancy 07/06/2017  ? tx on 07/04/17  ? Depression   ? Eating disorder   ? GERD (gastroesophageal reflux disease)   ? Hypertension in pregnancy, preeclampsia, severe, delivered 07/05/2017  ? Hypomania (HCC)   ? IBS (irritable bowel syndrome)   ? Insomnia   ? Migraines   ? Miscarriage March 2016  ? Mononucleosis   ? Mood disturbance   ? 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 Vomiting   ? ? ?Patient Active Problem List  ? Diagnosis Date Noted  ? Lower urinary tract infectious disease 07/29/2019  ? Chlamydia infection 08/23/2017  ? Depression   ? Panic attacks 07/07/2017  ? MDD (major depressive disorder), single episode, severe , no psychosis (HCC) 12/08/2014  ? GAD (generalized anxiety disorder) 12/08/2014  ? Bulimia nervosa 12/08/2014  ? ? ?Past Surgical History:  ?Procedure Laterality Date  ? COLONOSCOPY    ? ESOPHAGOGASTRODUODENOSCOPY ENDOSCOPY    ? ? ?OB History   ? ? Gravida  ?2  ? Para  ?1  ? Term  ?0  ? Preterm  ?1  ? AB  ?1  ? Living  ?1  ?  ? ? SAB  ?1  ? IAB  ?0  ? Ectopic  ?0   ? Multiple  ?0  ? Live Births  ?1  ?   ?  ?  ? ? ? ?Home Medications   ? ?Prior to Admission medications   ?Medication Sig Start Date End Date Taking? Authorizing Provider  ?amoxicillin (AMOXIL) 500 MG capsule Take 1 capsule (500 mg total) by mouth 2 (two) times daily for 10 days. 11/18/21 11/28/21 Yes Jaelynn Currier, Lurena Joiner, PA-C  ?meloxicam (MOBIC) 7.5 MG tablet Take 1 tablet (7.5 mg total) by mouth daily. 03/09/21   Coralyn Mark, NP  ?methocarbamol (ROBAXIN) 500 MG tablet Take 1 tablet (500 mg total) by mouth 2 (two) times daily. 04/02/21   White, Elita Boone, NP  ?ondansetron (ZOFRAN) 4 MG tablet Take 1 tablet (4 mg total) by mouth every 6 (six) hours. 12/12/20   Theron Arista, PA-C  ?predniSONE (DELTASONE) 20 MG tablet Take 2 tablets (40 mg total) by mouth daily. 04/02/21   Valinda Hoar, NP  ?famotidine (PEPCID) 20 MG tablet Take 1 tablet (20 mg total) by mouth daily. 07/13/20 08/17/20  Caccavale, Sophia, PA-C  ?fluticasone (FLONASE) 50 MCG/ACT nasal spray Place 1 spray into both nostrils daily. 03/28/20 08/17/20  Dahlia Byes A, NP  ?omeprazole (PRILOSEC) 20 MG capsule  Take 1 capsule (20 mg total) by mouth daily. 06/14/20 08/17/20  Unk Lightning, PA  ? ? ?Family History ?Family History  ?Problem Relation Age of Onset  ? Hypertension Mother   ? Hypertension Father   ? Mental illness Father   ? Hypertension Maternal Grandmother   ? Hypertension Paternal Grandmother   ? Hypertension Paternal Grandfather   ? Diabetes Paternal Grandfather   ? Brain cancer Maternal Aunt   ? Colon cancer Maternal Aunt   ? Pancreatic cancer Neg Hx   ? Esophageal cancer Neg Hx   ? ? ?Social History ?Social History  ? ?Tobacco Use  ? Smoking status: Passive Smoke Exposure - Never Smoker  ? Smokeless tobacco: Never  ?Vaping Use  ? Vaping Use: Never used  ?Substance Use Topics  ? Alcohol use: No  ?  Alcohol/week: 0.0 standard drinks  ? Drug use: No  ? ? ? ?Allergies   ?Patient has no known allergies. ? ? ?Review of Systems ?Review of  Systems  ?Constitutional:  Negative for fever.  ?HENT:  Positive for ear pain and sore throat. Negative for congestion, drooling, ear discharge, trouble swallowing and voice change.   ?Respiratory:  Negative for cough and shortness of breath.   ?Cardiovascular: Negative.   ?Gastrointestinal:  Negative for abdominal pain, diarrhea and vomiting.  ?Endocrine: Negative.   ?Genitourinary: Negative.   ?Musculoskeletal:  Negative for neck stiffness.  ?Skin:  Negative for rash.  ?Neurological:  Negative for headaches.  ?All other systems reviewed and are negative. ? ? ?Physical Exam ?Triage Vital Signs ?ED Triage Vitals  ?Enc Vitals Group  ?   BP 11/18/21 1009 129/78  ?   Pulse Rate 11/18/21 1009 (!) 102  ?   Resp 11/18/21 1009 19  ?   Temp 11/18/21 1009 99.8 ?F (37.7 ?C)  ?   Temp Source 11/18/21 1009 Oral  ?   SpO2 11/18/21 1009 98 %  ?   Weight 11/18/21 1007 139 lb 15.9 oz (63.5 kg)  ?   Height 11/18/21 1007 4\' 11"  (1.499 m)  ?   Head Circumference --   ?   Peak Flow --   ?   Pain Score 11/18/21 1007 10  ?   Pain Loc --   ?   Pain Edu? --   ?   Excl. in GC? --   ? ?No data found. ? ?Updated Vital Signs ?BP 129/78 (BP Location: Right Arm)   Pulse (!) 102   Temp 99.8 ?F (37.7 ?C) (Oral)   Resp 19   Ht 4\' 11"  (1.499 m)   Wt 139 lb 15.9 oz (63.5 kg)   SpO2 98%   BMI 28.27 kg/m?  ?   ? ?Physical Exam ?Vitals and nursing note reviewed.  ?Constitutional:   ?   Appearance: She is not ill-appearing.  ?HENT:  ?   Right Ear: Tympanic membrane and ear canal normal. No middle ear effusion. Tympanic membrane is not erythematous or bulging.  ?   Left Ear: Tympanic membrane and ear canal normal.  ?   Mouth/Throat:  ?   Pharynx: Uvula midline. Posterior oropharyngeal erythema present. No oropharyngeal exudate.  ?   Tonsils: No tonsillar exudate or tonsillar abscesses. 2+ on the right. 1+ on the left.  ?   Comments: Erythema with no exudate or abscess ?Eyes:  ?   Conjunctiva/sclera: Conjunctivae normal.  ?Cardiovascular:  ?   Rate  and Rhythm: Normal rate and regular rhythm.  ?   Heart  sounds: Normal heart sounds.  ?Pulmonary:  ?   Effort: Pulmonary effort is normal.  ?   Breath sounds: Normal breath sounds.  ?Abdominal:  ?   Palpations: Abdomen is soft.  ?   Tenderness: There is no abdominal tenderness.  ?Skin: ?   General: Skin is warm and dry.  ?Neurological:  ?   Mental Status: She is alert.  ? ? ? ?UC Treatments / Results  ?Labs ?(all labs ordered are listed, but only abnormal results are displayed) ?Labs Reviewed  ?POCT RAPID STREP A, ED / UC - Abnormal; Notable for the following components:  ?    Result Value  ? Streptococcus, Group A Screen (Direct) POSITIVE (*)   ? All other components within normal limits  ? ? ?EKG ? ? ?Radiology ?No results found. ? ?Procedures ?Procedures (including critical care time) ? ?Medications Ordered in UC ?Medications  ?acetaminophen (TYLENOL) tablet 650 mg (650 mg Oral Given 11/18/21 1039)  ? ? ?Initial Impression / Assessment and Plan / UC Course  ?I have reviewed the triage vital signs and the nursing notes. ? ?Pertinent labs & imaging results that were available during my care of the patient were reviewed by me and considered in my medical decision making (see chart for details). ? ? Positive strep test in clinic today. Presentation consistent with strep pharyngitis. Low concern for peritonsillar abscess - uvula midline, no visualized abscess, no trouble swallowing or breathing, voice normal. I believe the ear pain may be referred from the throat. Ear canal and TM are clear with no effusion noted, TM intact. Tylenol given in clinic for pain, will treat strep infection with amoxicillin x 10 days. Patient has no recent antibiotic use. Discussed side effects of medication and to take with food if she has upset stomach. Discussed importance of taking full course of antibiotic even if symptoms resolve mid-treatment.  ? ?Instructed to return to urgent care or emergency department if symptoms worsen or do not  improve. Patient agrees with plan. ? ?Final Clinical Impressions(s) / UC Diagnoses  ? ?Final diagnoses:  ?Strep pharyngitis  ?Ear pain, right  ? ? ? ?Discharge Instructions   ? ?  ?You tested positive for strep t

## 2021-11-18 NOTE — Discharge Instructions (Addendum)
You tested positive for strep throat. Please take antibiotic amoxicillin two times daily for 10 days. You can take one in the morning and one at night. May cause upset stomach and diarrhea. You may return to work after 24 hours of taking the medication. ? ?Please finish the whole course of medicine even if you are feeling better. ? ?Return to urgent care or emergency department if symptoms worsen or do not improve. ?

## 2021-11-21 DIAGNOSIS — Z419 Encounter for procedure for purposes other than remedying health state, unspecified: Secondary | ICD-10-CM | POA: Diagnosis not present

## 2021-12-22 DIAGNOSIS — Z419 Encounter for procedure for purposes other than remedying health state, unspecified: Secondary | ICD-10-CM | POA: Diagnosis not present

## 2022-01-21 DIAGNOSIS — Z419 Encounter for procedure for purposes other than remedying health state, unspecified: Secondary | ICD-10-CM | POA: Diagnosis not present

## 2022-02-21 DIAGNOSIS — G43709 Chronic migraine without aura, not intractable, without status migrainosus: Secondary | ICD-10-CM | POA: Diagnosis not present

## 2022-02-21 DIAGNOSIS — M5442 Lumbago with sciatica, left side: Secondary | ICD-10-CM | POA: Diagnosis not present

## 2022-02-21 DIAGNOSIS — G8929 Other chronic pain: Secondary | ICD-10-CM | POA: Diagnosis not present

## 2022-02-21 DIAGNOSIS — Z419 Encounter for procedure for purposes other than remedying health state, unspecified: Secondary | ICD-10-CM | POA: Diagnosis not present

## 2022-02-22 DIAGNOSIS — R519 Headache, unspecified: Secondary | ICD-10-CM | POA: Diagnosis not present

## 2022-03-14 DIAGNOSIS — M5416 Radiculopathy, lumbar region: Secondary | ICD-10-CM | POA: Diagnosis not present

## 2022-03-14 DIAGNOSIS — Z3201 Encounter for pregnancy test, result positive: Secondary | ICD-10-CM | POA: Diagnosis not present

## 2022-03-24 DIAGNOSIS — Z419 Encounter for procedure for purposes other than remedying health state, unspecified: Secondary | ICD-10-CM | POA: Diagnosis not present

## 2022-04-18 DIAGNOSIS — O3680X9 Pregnancy with inconclusive fetal viability, other fetus: Secondary | ICD-10-CM | POA: Diagnosis not present

## 2022-04-18 DIAGNOSIS — N912 Amenorrhea, unspecified: Secondary | ICD-10-CM | POA: Diagnosis not present

## 2022-04-18 DIAGNOSIS — Z3A09 9 weeks gestation of pregnancy: Secondary | ICD-10-CM | POA: Diagnosis not present

## 2022-04-18 DIAGNOSIS — K219 Gastro-esophageal reflux disease without esophagitis: Secondary | ICD-10-CM | POA: Diagnosis not present

## 2022-04-18 DIAGNOSIS — O219 Vomiting of pregnancy, unspecified: Secondary | ICD-10-CM | POA: Diagnosis not present

## 2022-04-18 DIAGNOSIS — Z369 Encounter for antenatal screening, unspecified: Secondary | ICD-10-CM | POA: Diagnosis not present

## 2022-04-18 DIAGNOSIS — Z8759 Personal history of other complications of pregnancy, childbirth and the puerperium: Secondary | ICD-10-CM | POA: Diagnosis not present

## 2022-04-18 DIAGNOSIS — Z113 Encounter for screening for infections with a predominantly sexual mode of transmission: Secondary | ICD-10-CM | POA: Diagnosis not present

## 2022-04-18 LAB — HEPATITIS C ANTIBODY: HCV Ab: NEGATIVE

## 2022-04-18 LAB — OB RESULTS CONSOLE RUBELLA ANTIBODY, IGM: Rubella: IMMUNE

## 2022-04-18 LAB — OB RESULTS CONSOLE RPR: RPR: NONREACTIVE

## 2022-04-18 LAB — OB RESULTS CONSOLE HEPATITIS B SURFACE ANTIGEN: Hepatitis B Surface Ag: NEGATIVE

## 2022-04-18 LAB — OB RESULTS CONSOLE HIV ANTIBODY (ROUTINE TESTING): HIV: NONREACTIVE

## 2022-04-23 DIAGNOSIS — Z419 Encounter for procedure for purposes other than remedying health state, unspecified: Secondary | ICD-10-CM | POA: Diagnosis not present

## 2022-05-16 DIAGNOSIS — R8271 Bacteriuria: Secondary | ICD-10-CM | POA: Diagnosis not present

## 2022-05-16 DIAGNOSIS — Z3481 Encounter for supervision of other normal pregnancy, first trimester: Secondary | ICD-10-CM | POA: Diagnosis not present

## 2022-05-16 DIAGNOSIS — Z315 Encounter for genetic counseling: Secondary | ICD-10-CM | POA: Diagnosis not present

## 2022-05-19 ENCOUNTER — Encounter (HOSPITAL_COMMUNITY): Payer: Self-pay | Admitting: Obstetrics and Gynecology

## 2022-05-19 ENCOUNTER — Inpatient Hospital Stay (HOSPITAL_COMMUNITY): Payer: Medicaid Other

## 2022-05-19 ENCOUNTER — Inpatient Hospital Stay (HOSPITAL_COMMUNITY)
Admission: AD | Admit: 2022-05-19 | Discharge: 2022-05-19 | Disposition: A | Discharge status: Home / Self Care | Payer: Medicaid Other | Attending: Obstetrics and Gynecology | Admitting: Obstetrics and Gynecology

## 2022-05-19 ENCOUNTER — Other Ambulatory Visit: Payer: Self-pay

## 2022-05-19 DIAGNOSIS — B9689 Other specified bacterial agents as the cause of diseases classified elsewhere: Secondary | ICD-10-CM

## 2022-05-19 DIAGNOSIS — R109 Unspecified abdominal pain: Secondary | ICD-10-CM

## 2022-05-19 DIAGNOSIS — O2341 Unspecified infection of urinary tract in pregnancy, first trimester: Secondary | ICD-10-CM | POA: Diagnosis not present

## 2022-05-19 DIAGNOSIS — O23591 Infection of other part of genital tract in pregnancy, first trimester: Secondary | ICD-10-CM | POA: Diagnosis not present

## 2022-05-19 DIAGNOSIS — Z3A13 13 weeks gestation of pregnancy: Secondary | ICD-10-CM | POA: Diagnosis not present

## 2022-05-19 DIAGNOSIS — O26899 Other specified pregnancy related conditions, unspecified trimester: Secondary | ICD-10-CM | POA: Diagnosis not present

## 2022-05-19 DIAGNOSIS — O2342 Unspecified infection of urinary tract in pregnancy, second trimester: Secondary | ICD-10-CM

## 2022-05-19 DIAGNOSIS — O09291 Supervision of pregnancy with other poor reproductive or obstetric history, first trimester: Secondary | ICD-10-CM | POA: Insufficient documentation

## 2022-05-19 DIAGNOSIS — O26891 Other specified pregnancy related conditions, first trimester: Secondary | ICD-10-CM | POA: Diagnosis not present

## 2022-05-19 HISTORY — DX: Urinary tract infection, site not specified: N39.0

## 2022-05-19 LAB — URINALYSIS, ROUTINE W REFLEX MICROSCOPIC
Bilirubin Urine: NEGATIVE
Glucose, UA: NEGATIVE mg/dL
Hgb urine dipstick: NEGATIVE
Ketones, ur: NEGATIVE mg/dL
Leukocytes,Ua: NEGATIVE
Nitrite: POSITIVE — AB
Protein, ur: NEGATIVE mg/dL
Specific Gravity, Urine: 1.019 (ref 1.005–1.030)
pH: 7 (ref 5.0–8.0)

## 2022-05-19 LAB — CBC
HCT: 37 % (ref 36.0–46.0)
Hemoglobin: 12.7 g/dL (ref 12.0–15.0)
MCH: 28.9 pg (ref 26.0–34.0)
MCHC: 34.3 g/dL (ref 30.0–36.0)
MCV: 84.3 fL (ref 80.0–100.0)
Platelets: 349 10*3/uL (ref 150–400)
RBC: 4.39 MIL/uL (ref 3.87–5.11)
RDW: 13.7 % (ref 11.5–15.5)
WBC: 7 10*3/uL (ref 4.0–10.5)
nRBC: 0 % (ref 0.0–0.2)

## 2022-05-19 LAB — WET PREP, GENITAL
Sperm: NONE SEEN
Trich, Wet Prep: NONE SEEN
WBC, Wet Prep HPF POC: <10 (ref <10)
Yeast Wet Prep HPF POC: NONE SEEN

## 2022-05-19 LAB — HCG, QUANTITATIVE, PREGNANCY: hCG, Beta Chain, Quant, S: 129909 m[IU]/mL — ABNORMAL HIGH (ref <5)

## 2022-05-19 LAB — POCT PREGNANCY, URINE: Preg Test, Ur: POSITIVE — AB

## 2022-05-19 MED ORDER — CEFADROXIL 500 MG PO CAPS: 500.0000 mg | ORAL_CAPSULE | Freq: Two times a day (BID) | ORAL | 0 refills | Status: AC

## 2022-05-19 MED ORDER — METRONIDAZOLE 0.75 % VA GEL: 1.0000 | Freq: Every day | VAGINAL | 1 refills | Status: DC

## 2022-05-19 NOTE — MAU Note (Deleted)
Unable to keep anything down since Sunday.  Been pregnant before, feels these are not pregnancy symptoms.  N/V, chills, fever like symptoms (didn't check temp), bad headaches, dizzy..  Was not given rx for nausea at dr's, "wasn't as bad".  Has not taken Tylenol or anything for HA.   Pt spitting.

## 2022-05-19 NOTE — MAU Provider Note (Addendum)
History    Chief Complaint  Patient presents with   Abdominal Pain   Problem List Items Addressed This Visit   None Visit Diagnoses     [redacted] weeks gestation of pregnancy    -  Primary   Abdominal pain affecting pregnancy       Urinary tract infection in mother during second trimester of pregnancy       Bacterial vaginosis           Ms. Crusoe is a 21 yo [id number] at [redacted]w[redacted]d who presents to the MAU with abdominal pain. This pain woke her up out of her sleep this morning at 7 am with constant 9/10, sharp, stabbing, and pulling pain in her lower abdominal area that worsens when lying down and breathing and feels "similar to the pain of her first miscarriage". She denies constipation, N/V/D, bleeding, discharge, and leakage.   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   UTI (urinary tract infection)    Vomiting     Past Surgical History:  Procedure Laterality Date   COLONOSCOPY     ESOPHAGOGASTRODUODENOSCOPY ENDOSCOPY      Family History  Problem Relation Age of Onset   Asthma Mother    Hypertension Mother    Hypertension Father    Mental illness Father    Brain cancer Maternal Aunt    Colon cancer Maternal Aunt    Hypertension Maternal Grandmother    Hypertension Paternal Grandmother    Hypertension Paternal Grandfather    Diabetes Paternal Grandfather     Pancreatic cancer Neg Hx    Esophageal cancer Neg Hx     Social History   Tobacco Use   Smoking status: Never    Passive exposure: Yes   Smokeless tobacco: Never  Vaping Use   Vaping Use: Never used  Substance Use Topics   Alcohol use: No    Alcohol/week: 0.0 standard drinks of alcohol   Drug use: No    Allergies: No Known Allergies  Medications Prior to Admission  Medication Sig Dispense Refill Last Dose   omeprazole (PRILOSEC) 10 MG capsule Take 10 mg by mouth daily.   05/18/2022   ondansetron (ZOFRAN) 4 MG tablet Take 1 tablet (4 mg total) by mouth every 6 (six) hours. 12 tablet 0 05/18/2022   promethazine (PHENERGAN) 25 MG tablet Take 25 mg by mouth every 6 (six) hours as needed for nausea or vomiting.   Past Week   meloxicam (MOBIC) 7.5 MG tablet Take 1 tablet (7.5 mg total) by mouth daily. 14 tablet 0    methocarbamol (ROBAXIN)  500 MG tablet Take 1 tablet (500 mg total) by mouth 2 (two) times daily. 20 tablet 0    predniSONE (DELTASONE) 20 MG tablet Take 2 tablets (40 mg total) by mouth daily. 10 tablet 0     Review of Systems  Gastrointestinal:  Positive for abdominal pain.   Physical Exam Blood pressure 128/73, pulse (!) 107, temperature 98.3 F (36.8 C), temperature source Oral, resp. rate 18, height 4\' 11"  (1.499 m), weight 69.9 kg, last menstrual period 02/14/2022, SpO2 99 %.  Physical Exam Abdominal:     Tenderness: There is abdominal tenderness in the suprapubic area and left lower quadrant.  Skin:    General: Skin is warm.  Neurological:     Mental Status: She is alert and oriented to person, place, and time.  Psychiatric:        Mood and Affect: Mood is anxious.     MAU Course Procedures  MDM  Orders Placed This Encounter  Procedures   Wet prep, genital   Culture, OB Urine   02/16/2022 OB Comp Less 14 Wks   Urinalysis, Routine w reflex microscopic Urine, Clean Catch   CBC   hCG, quantitative, pregnancy   Pregnancy, urine POC   ABO/Rh  A/P:    Patient was found to have BV and a UTI which explains the symptoms she's been having.  Will treat with Metronidazole and Duracefr respectively and have her follow up with her prenatal care provider.   CNM attestation:  I have seen and examined this patient and agree with above documentation in the medical student's note.   TIFFANNIE SLOSS is a 21 y.o. [phone] at [redacted]w[redacted]d reporting lower abdominal pain that woke her out of her sleep around 7am. She reports pain is constant, crampy and sharp. It is similar to the time she had a miscarriage which is what worries her. She denies vaginal bleeding, discharge, itching or odor. No urinary s/s. She reports normal BM's, last BM was last night.    PE: Patient Vitals for the past 24 hrs:  BP Temp Temp src Pulse Resp SpO2 Height Weight  05/19/22 1119 119/76 -- -- 88 -- -- -- --  05/19/22 0853 128/73 98.3 F (36.8 C) Oral (!) 107 18 99 % -- --  05/19/22 0850 -- -- -- -- -- -- 4\' 11"  (1.499 m) 69.9 kg   Gen: alert, oriented, no acute distress Resp: normal effort, no distress Heart: regular rate Abd: soft, tenderness in suprapubic and left lower quadrant regions GU: patient collected blind swabs   ROS, labs, PMH reviewed  05/21/22 OB Comp Less 14 Wks  Result Date: 05/19/2022 CLINICAL DATA:  21 year old female with abdominal pain in the 1st trimester of pregnancy. Estimated gestational age by LMP 13 weeks and 3 days. EXAM: OBSTETRIC <14 WK ULTRASOUND TECHNIQUE: Transabdominal ultrasound was performed for evaluation of the gestation as well as the maternal uterus and adnexal regions. COMPARISON:  None relevant. FINDINGS: Intrauterine gestational sac: Single Yolk sac:  Visible Embryo:  Visible Cardiac Activity: Detected Heart Rate: 153 bpm CRL:   72.8 mm   13 w 3 d                  05/21/2022 EDC: 11/21/2022 Subchorionic hemorrhage:  None visualized. Maternal uterus/adnexae: No pelvic free fluid. Left ovary appears normal, 2.0 x 0.9 x 1.4 cm. Right ovary could not be  identified. IMPRESSION: Single living IUP with estimated gestational age of [redacted] weeks and 3 days by crown-rump length. No acute  maternal findings visualized. Electronically Signed   By: Odessa Fleming M.D.   On: 05/19/2022 10:47    Orders Placed This Encounter  Procedures   Wet prep, genital   Culture, OB Urine   US OB Comp Less 14 Wks   Urinalysis, Routine w reflex microscopic Urine, Clean Catch   CBC   hCG, quantitative, pregnancy   Pregnancy, urine POC   ABO/Rh   Discharge patient   Meds ordered this encounter  Medications   cefadroxil (DURICEF) 500 MG capsule    Sig: Take 1 capsule (500 mg total) by mouth 2 (two) times daily for 7 days.    Dispense:  14 capsule    Refill:  0    Order Specific Question:   Supervising Provider    Answer:   Jaynie Collins A [[id number]]   metroNIDAZOLE (METROGEL) 0.75 % vaginal gel    Sig: Place 1 Applicatorful vaginally at bedtime. Apply one applicatorful to vagina at bedtime for 5 days    Dispense:  70 g    Refill:  1    Order Specific Question:   Supervising Provider    Answer:   Jaynie Collins A [[id number]]    MDM UA, culture pending Wet prep, GC/CT CBC, HCG, ABO/RH Ultrasound  UA positive for nitrites. Will order urine culture but Duricef to be sent to patient's pharmacy. Wet prep positive for clue cells, so will send Metrogel as well. Other labs unremarkable. Ultrasound shows SIUP with FHR present  Assessment: 1. [redacted] weeks gestation of pregnancy   2. Abdominal pain affecting pregnancy   3. Urinary tract infection in mother during second trimester of pregnancy   4. Bacterial vaginosis     Plan: - Discharge home in stable condition - Strict return precautions. Return to MAU as needed for new/worsening symptoms - Duricef and Metrogel sent to pharmacy - Keep OB appointment as scheduled at [facility], CNM 05/19/2022 11:26 AM

## 2022-05-19 NOTE — MAU Note (Signed)
Pain started this morning, sometimes cramping, sometimes sharp, sometimes a pulling sensation. No bleeding or d/c. Denies GI or GU symptoms.  Has been seen in the office, had [place].

## 2022-05-19 NOTE — MAU Note (Signed)
Jamie Lawrence is a 21 y.o. at Unknown here in MAU reporting: lower abdominal pain that began this morning when she woke up.  Denies VB. LMP: 02/14/2022 Onset of complaint: today Pain score: 9 Vitals:   05/19/22 0853  BP: 128/73  Pulse: (!) 107  Resp: 18  Temp: 98.3 F (36.8 C)  SpO2: 99%     FHT:N/A Lab orders placed from triage:   UPT

## 2022-05-20 LAB — ABO/RH: ABO/RH(D): O POS

## 2022-05-22 LAB — CULTURE, OB URINE: Culture: >=100000 — AB

## 2022-05-22 LAB — GC/CHLAMYDIA PROBE AMP (~~LOC~~) NOT AT ARMC
Chlamydia: NEGATIVE
Comment: NEGATIVE
Comment: NORMAL
Neisseria Gonorrhea: NEGATIVE

## 2022-05-24 DIAGNOSIS — Z419 Encounter for procedure for purposes other than remedying health state, unspecified: Secondary | ICD-10-CM | POA: Diagnosis not present

## 2022-05-31 DIAGNOSIS — R519 Headache, unspecified: Secondary | ICD-10-CM | POA: Diagnosis not present

## 2022-06-08 ENCOUNTER — Inpatient Hospital Stay (HOSPITAL_COMMUNITY)
Admission: AD | Admit: 2022-06-08 | Discharge: 2022-06-08 | Disposition: A | Discharge status: Home / Self Care | Payer: Medicaid Other | Attending: Obstetrics and Gynecology | Admitting: Obstetrics and Gynecology

## 2022-06-08 ENCOUNTER — Encounter (HOSPITAL_COMMUNITY): Payer: Self-pay | Admitting: Obstetrics and Gynecology

## 2022-06-08 DIAGNOSIS — K6289 Other specified diseases of anus and rectum: Secondary | ICD-10-CM | POA: Insufficient documentation

## 2022-06-08 DIAGNOSIS — Z3A16 16 weeks gestation of pregnancy: Secondary | ICD-10-CM | POA: Insufficient documentation

## 2022-06-08 DIAGNOSIS — O2242 Hemorrhoids in pregnancy, second trimester: Secondary | ICD-10-CM | POA: Insufficient documentation

## 2022-06-08 DIAGNOSIS — O26892 Other specified pregnancy related conditions, second trimester: Secondary | ICD-10-CM | POA: Diagnosis not present

## 2022-06-08 HISTORY — DX: Gestational (pregnancy-induced) hypertension without significant proteinuria, unspecified trimester: O13.9

## 2022-06-08 MED ORDER — POLYETHYLENE GLYCOL 3350 17 GM/SCOOP PO POWD: 17.0000 g | Freq: Every day | ORAL | 0 refills | Status: DC

## 2022-06-08 MED ORDER — LIDOCAINE 4 % EX GEL: 1.0000 | Freq: Three times a day (TID) | CUTANEOUS | 0 refills | Status: DC | PRN

## 2022-06-08 MED ORDER — PRAMOXINE HCL (PERIANAL) 1 % EX FOAM: 1.0000 | Freq: Three times a day (TID) | CUTANEOUS | 0 refills | Status: DC | PRN

## 2022-06-08 MED ORDER — WITCH HAZEL-GLYCERIN EX PADS: 1.0000 | MEDICATED_PAD | CUTANEOUS | 0 refills | Status: DC | PRN

## 2022-06-08 MED ORDER — IBUPROFEN 600 MG PO TABS
600.0000 mg | ORAL_TABLET | Freq: Four times a day (QID) | ORAL | Status: DC | PRN
  Administered 2022-06-08: 600 mg via ORAL
  Filled 2022-06-08: qty 1

## 2022-06-08 NOTE — MAU Note (Signed)
...  Jamie Lawrence is a 21 y.o. at [redacted]w[redacted]d here in MAU reporting: Constant rectal pain for the past two days that is worsening. She reports the pain is sharp, shooting, and stabbing. Denies recent IC. Denies rectal intercourse. Denies rectal bleeding. Denies VB.   No known history of hemorrhoids. Patient reports she has had smooth bowel movements every day. Last BM before coming to MAU. Patient unable to sit down or lie down. No positions provide relief.  Onset of complaint: x2 days ago Pain score: 10/10 rectum    FHT: 155 doppler

## 2022-06-08 NOTE — MAU Provider Note (Signed)
History     CSN: [id number]  Arrival date and time: 06/08/22 1759   Event Date/Time   First Provider Initiated Contact with Patient 06/08/22 1925      Chief Complaint  Patient presents with   Rectal Pain   21 y.o. [id number] @16 .2 wks presenting with anal pain. Reports onset 2 days ago. Pain is intermittent, sharp, and cramping. Rates pain 9/10. Has not taken anything for it. Denies rectal bleeding. Reports 1-2 soft BMs daily, had 1 today. Denies sex or anything placed via anus. Pain is worse with sitting, standing, and BMs. Denies abdominal pain or cramping.   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   Pregnancy induced hypertension    UTI (urinary tract infection)    Vomiting     Past Surgical History:  Procedure Laterality Date   COLONOSCOPY     ESOPHAGOGASTRODUODENOSCOPY ENDOSCOPY      Family History  Problem Relation Age of Onset   Asthma Mother    Hypertension Mother    Hypertension Father    Mental illness Father    Brain cancer Maternal Aunt    Colon cancer Maternal Aunt    Hypertension Maternal Grandmother    Hypertension Paternal Grandmother    Hypertension Paternal Grandfather    Diabetes Paternal Grandfather    Pancreatic cancer Neg Hx    Esophageal cancer Neg Hx     Social History   Tobacco Use   Smoking status: Never     Passive exposure: Yes   Smokeless tobacco: Never  Vaping Use   Vaping Use: Never used  Substance Use Topics   Alcohol use: No    Alcohol/week: 0.0 standard drinks of alcohol   Drug use: No    Allergies: No Known Allergies  Medications Prior to Admission  Medication Sig Dispense Refill Last Dose   omeprazole (PRILOSEC) 10 MG capsule Take 10 mg by mouth daily.   Past Week   ondansetron (ZOFRAN) 4 MG tablet Take 1 tablet (4 mg total) by mouth every 6 (six) hours. 12 tablet 0 Past Week   metroNIDAZOLE (METROGEL) 0.75 % vaginal gel Place 1 Applicatorful vaginally at bedtime. Apply one applicatorful to vagina at bedtime for 5 days 70 g 1    promethazine (PHENERGAN) 25 MG tablet Take 25 mg by mouth every 6 (six) hours as needed for nausea or vomiting.       Review of Systems  Gastrointestinal:  Negative for abdominal pain, anal bleeding, constipation and diarrhea.  Genitourinary:  Negative for vaginal bleeding.   Physical Exam   Blood pressure 118/75, pulse  79, temperature 98.5 F (36.9 C), temperature source Oral, resp. rate 15, height 4\' 11"  (1.499 m), weight 68 kg, last menstrual period 02/14/2022, SpO2 98 %.  Physical Exam Vitals and nursing note reviewed. Exam conducted with a chaperone present.  Constitutional:      General: She is not in acute distress.    Appearance: Normal appearance.  HENT:     Head: Normocephalic and atraumatic.  Cardiovascular:     Rate and Rhythm: Normal rate.  Pulmonary:     Effort: Pulmonary effort is normal. No respiratory distress.  Abdominal:     General: There is no distension.     Palpations: Abdomen is soft. There is no mass.     Tenderness: There is no abdominal tenderness. There is no guarding or rebound.     Hernia: No hernia is present.  Genitourinary:      Comments: VE: closed/long Musculoskeletal:        General: Normal range of motion.     Cervical back: Normal range of motion.  Skin:    General: Skin is warm and dry.   Neurological:     General: No focal deficit present.     Mental Status: She is alert and oriented to person, place, and time.  Psychiatric:        Mood and Affect: Mood normal.        Behavior: Behavior normal.   FHT 155  No results found for this or any previous visit (from the past 24 hour(s)).  MAU Course  Procedures Ibuprofen  MDM Consult with Dr. 02/16/2022 and pt examined by him. Suspects internal hemorrhoid that is partially protruding out. Recommend symptom management. Discussed plan with pt. Stable for discharge.  Assessment and Plan   1. [redacted] weeks gestation of pregnancy   2. Hemorrhoids during pregnancy in second trimester    Discharge home Follow up at [facility] as scheduled Return precautions Rx Tucks Rx Proctofoam Rx Lidocaine gel Rx Miralax  Allergies as of 06/08/2022   No Known Allergies      Medication List     STOP taking these medications    metroNIDAZOLE 0.75 % vaginal gel Commonly known as: METROGEL   ondansetron 4 MG tablet Commonly known as: ZOFRAN       TAKE these medications    Lidocaine 4 % Gel Apply 1 Application topically 3 (three) times daily as needed.   omeprazole 10 MG capsule Commonly known as: PRILOSEC Take 10 mg by mouth daily.   polyethylene glycol powder 17 GM/SCOOP powder Commonly known as: GLYCOLAX/MIRALAX Take 17 g by mouth daily.   pramoxine 1 % foam Commonly known as: PROCTOFOAM Place 1 Application rectally 3 (three) times daily as needed for anal itching.   promethazine 25 MG tablet Commonly known as: PHENERGAN Take 25 mg by mouth every 6 (six) hours as needed for nausea or vomiting.   witch hazel-glycerin pad Commonly known as: TUCKS Apply 1 Application topically as needed for itching.        06/10/2022, CNM 06/08/2022, 8:53 PM

## 2022-06-09 ENCOUNTER — Observation Stay (HOSPITAL_COMMUNITY)
Admission: AD | Admit: 2022-06-09 | Discharge: 2022-06-10 | Disposition: A | Discharge status: Home / Self Care | Payer: Medicaid Other | Attending: Obstetrics and Gynecology | Admitting: Obstetrics and Gynecology

## 2022-06-09 ENCOUNTER — Other Ambulatory Visit: Payer: Self-pay

## 2022-06-09 ENCOUNTER — Inpatient Hospital Stay (HOSPITAL_COMMUNITY): Payer: Medicaid Other

## 2022-06-09 ENCOUNTER — Encounter (HOSPITAL_COMMUNITY): Payer: Self-pay | Admitting: Obstetrics and Gynecology

## 2022-06-09 DIAGNOSIS — O26832 Pregnancy related renal disease, second trimester: Principal | ICD-10-CM | POA: Insufficient documentation

## 2022-06-09 DIAGNOSIS — O2342 Unspecified infection of urinary tract in pregnancy, second trimester: Secondary | ICD-10-CM | POA: Diagnosis not present

## 2022-06-09 DIAGNOSIS — T68XXXA Hypothermia, initial encounter: Secondary | ICD-10-CM | POA: Diagnosis not present

## 2022-06-09 DIAGNOSIS — Z3A16 16 weeks gestation of pregnancy: Secondary | ICD-10-CM | POA: Insufficient documentation

## 2022-06-09 DIAGNOSIS — N200 Calculus of kidney: Secondary | ICD-10-CM | POA: Diagnosis not present

## 2022-06-09 DIAGNOSIS — Z7722 Contact with and (suspected) exposure to environmental tobacco smoke (acute) (chronic): Secondary | ICD-10-CM | POA: Insufficient documentation

## 2022-06-09 DIAGNOSIS — K6289 Other specified diseases of anus and rectum: Secondary | ICD-10-CM | POA: Diagnosis not present

## 2022-06-09 DIAGNOSIS — M549 Dorsalgia, unspecified: Secondary | ICD-10-CM | POA: Diagnosis not present

## 2022-06-09 DIAGNOSIS — M545 Low back pain, unspecified: Secondary | ICD-10-CM | POA: Diagnosis not present

## 2022-06-09 DIAGNOSIS — B9620 Unspecified Escherichia coli [E. coli] as the cause of diseases classified elsewhere: Secondary | ICD-10-CM | POA: Insufficient documentation

## 2022-06-09 DIAGNOSIS — O99612 Diseases of the digestive system complicating pregnancy, second trimester: Secondary | ICD-10-CM | POA: Diagnosis present

## 2022-06-09 DIAGNOSIS — R000 Tachycardia, unspecified: Secondary | ICD-10-CM | POA: Diagnosis not present

## 2022-06-09 DIAGNOSIS — N23 Unspecified renal colic: Secondary | ICD-10-CM | POA: Diagnosis present

## 2022-06-09 DIAGNOSIS — Z743 Need for continuous supervision: Secondary | ICD-10-CM | POA: Diagnosis not present

## 2022-06-09 DIAGNOSIS — N1330 Unspecified hydronephrosis: Secondary | ICD-10-CM | POA: Diagnosis not present

## 2022-06-09 LAB — COMPREHENSIVE METABOLIC PANEL
ALT: 15 U/L (ref 0–44)
AST: 17 U/L (ref 15–41)
Albumin: 3.3 g/dL — ABNORMAL LOW (ref 3.5–5.0)
Alkaline Phosphatase: 71 U/L (ref 38–126)
Anion gap: 10 (ref 5–15)
BUN: 5 mg/dL — ABNORMAL LOW (ref 6–20)
CO2: 21 mmol/L — ABNORMAL LOW (ref 22–32)
Calcium: 9 mg/dL (ref 8.9–10.3)
Chloride: 104 mmol/L (ref 98–111)
Creatinine, Ser: 0.6 mg/dL (ref 0.44–1.00)
GFR, Estimated: >60 mL/min (ref >60)
Glucose, Bld: 94 mg/dL (ref 70–99)
Potassium: 3.9 mmol/L (ref 3.5–5.1)
Sodium: 135 mmol/L (ref 135–145)
Total Bilirubin: 0.4 mg/dL (ref 0.3–1.2)
Total Protein: 6.7 g/dL (ref 6.5–8.1)

## 2022-06-09 LAB — URINALYSIS, ROUTINE W REFLEX MICROSCOPIC
Bilirubin Urine: NEGATIVE
Glucose, UA: NEGATIVE mg/dL
Ketones, ur: 80 mg/dL — AB
Leukocytes,Ua: NEGATIVE
Nitrite: POSITIVE — AB
Protein, ur: NEGATIVE mg/dL
Specific Gravity, Urine: 1.019 (ref 1.005–1.030)
pH: 6 (ref 5.0–8.0)

## 2022-06-09 LAB — CBC WITH DIFFERENTIAL/PLATELET
Abs Immature Granulocytes: 0.02 10*3/uL (ref 0.00–0.07)
Basophils Absolute: 0 10*3/uL (ref 0.0–0.1)
Basophils Relative: 0 %
Eosinophils Absolute: 0.1 10*3/uL (ref 0.0–0.5)
Eosinophils Relative: 1 %
HCT: 37.2 % (ref 36.0–46.0)
Hemoglobin: 12.8 g/dL (ref 12.0–15.0)
Immature Granulocytes: 0 %
Lymphocytes Relative: 28 %
Lymphs Abs: 2.5 10*3/uL (ref 0.7–4.0)
MCH: 29.4 pg (ref 26.0–34.0)
MCHC: 34.4 g/dL (ref 30.0–36.0)
MCV: 85.5 fL (ref 80.0–100.0)
Monocytes Absolute: 0.7 10*3/uL (ref 0.1–1.0)
Monocytes Relative: 8 %
Neutro Abs: 5.6 10*3/uL (ref 1.7–7.7)
Neutrophils Relative %: 63 %
Platelets: 344 10*3/uL (ref 150–400)
RBC: 4.35 MIL/uL (ref 3.87–5.11)
RDW: 13.7 % (ref 11.5–15.5)
WBC: 8.9 10*3/uL (ref 4.0–10.5)
nRBC: 0 % (ref 0.0–0.2)

## 2022-06-09 MED ORDER — LACTATED RINGERS IV BOLUS
1000.0000 mL | Freq: Once | INTRAVENOUS | Status: AC
  Administered 2022-06-09: 1000 mL via INTRAVENOUS

## 2022-06-09 MED ORDER — SODIUM CHLORIDE 0.9 % IV SOLN
2.0000 g | Freq: Once | INTRAVENOUS | Status: AC
  Administered 2022-06-09: 2 g via INTRAVENOUS
  Filled 2022-06-09: qty 20

## 2022-06-09 MED ORDER — ZOLPIDEM TARTRATE 5 MG PO TABS: 5.0000 mg | ORAL_TABLET | Freq: Every evening | ORAL | Status: DC | PRN

## 2022-06-09 MED ORDER — PRENATAL MULTIVITAMIN CH
1.0000 | ORAL_TABLET | Freq: Every day | ORAL | Status: DC
  Administered 2022-06-10: 1 via ORAL
  Filled 2022-06-09: qty 1

## 2022-06-09 MED ORDER — ACETAMINOPHEN 325 MG PO TABS: 650.0000 mg | ORAL_TABLET | ORAL | Status: DC | PRN

## 2022-06-09 MED ORDER — CYCLOBENZAPRINE HCL 5 MG PO TABS
10.0000 mg | ORAL_TABLET | Freq: Once | ORAL | Status: AC
  Administered 2022-06-09: 10 mg via ORAL
  Filled 2022-06-09: qty 2

## 2022-06-09 MED ORDER — DOCUSATE SODIUM 100 MG PO CAPS: 100.0000 mg | ORAL_CAPSULE | Freq: Every day | ORAL | Status: DC

## 2022-06-09 MED ORDER — SODIUM CHLORIDE 0.9 % IV SOLN: INTRAVENOUS | Status: DC

## 2022-06-09 MED ORDER — HYDROMORPHONE HCL 1 MG/ML IJ SOLN
1.0000 mg | Freq: Once | INTRAMUSCULAR | Status: AC
  Administered 2022-06-09: 1 mg via INTRAVENOUS
  Filled 2022-06-09: qty 1

## 2022-06-09 MED ORDER — ONDANSETRON HCL 4 MG/2ML IJ SOLN
4.0000 mg | Freq: Once | INTRAMUSCULAR | Status: AC
  Administered 2022-06-09: 4 mg via INTRAVENOUS
  Filled 2022-06-09: qty 2

## 2022-06-09 MED ORDER — PROCHLORPERAZINE EDISYLATE 10 MG/2ML IJ SOLN
10.0000 mg | Freq: Four times a day (QID) | INTRAMUSCULAR | Status: DC | PRN
  Administered 2022-06-10: 10 mg via INTRAVENOUS
  Filled 2022-06-09 (×2): qty 2

## 2022-06-09 MED ORDER — CYCLOBENZAPRINE HCL 5 MG PO TABS: 10.0000 mg | ORAL_TABLET | Freq: Once | ORAL | Status: DC

## 2022-06-09 MED ORDER — CALCIUM CARBONATE ANTACID 500 MG PO CHEW: 2.0000 | CHEWABLE_TABLET | ORAL | Status: DC | PRN

## 2022-06-09 MED ORDER — LACTATED RINGERS IV SOLN: INTRAVENOUS | Status: DC

## 2022-06-09 MED ORDER — HYDROMORPHONE HCL 1 MG/ML IJ SOLN
1.0000 mg | INTRAMUSCULAR | Status: DC | PRN
  Administered 2022-06-10 (×3): 1 mg via INTRAVENOUS
  Filled 2022-06-09 (×3): qty 1

## 2022-06-09 MED ORDER — KETOROLAC TROMETHAMINE 60 MG/2ML IM SOLN
60.0000 mg | Freq: Once | INTRAMUSCULAR | Status: AC
  Administered 2022-06-09: 60 mg via INTRAMUSCULAR
  Filled 2022-06-09: qty 2

## 2022-06-09 NOTE — MAU Provider Note (Cosign Needed Addendum)
History     CSN: [id number]  Arrival date and time: 06/09/22 1551   Event Date/Time   First Provider Initiated Contact with Patient 06/09/22 1611      Chief Complaint  Patient presents with   Back Pain   Jamie Lawrence , a  21 y.o. [phone] at 127w3d presents to MAU via EMS with new onset right sided lower back pain that started 1 hour ago. Patient states she got up to go pee, and during urination she began to feel "constant, sharp stabbing, low back pain. She currently rates pain a 10/10. Denies attempting to relieve pain prior to calling EMS. Denies painful or burning urination. Denies fever and chills. Endorses a history of Kidney infections, but states "this is worse." Denies vaginal bleeding, leaking of fluid and contractions.          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   Pregnancy induced hypertension    UTI (urinary tract infection)    Vomiting     Past Surgical History:  Procedure Laterality Date   COLONOSCOPY     ESOPHAGOGASTRODUODENOSCOPY ENDOSCOPY      Family History  Problem Relation Age of Onset   Asthma Mother    Hypertension Mother    Hypertension Father    Mental illness Father    Brain cancer Maternal Aunt    Colon cancer Maternal Aunt    Hypertension Maternal Grandmother    Hypertension Paternal Grandmother     Hypertension Paternal Grandfather    Diabetes Paternal Grandfather    Pancreatic cancer Neg Hx    Esophageal cancer Neg Hx     Social History   Tobacco Use   Smoking status: Never    Passive exposure: Yes   Smokeless tobacco: Never  Vaping Use   Vaping Use: Never used  Substance Use Topics   Alcohol use: No    Alcohol/week: 0.0 standard drinks of alcohol   Drug use: No    Allergies: No Known Allergies  Medications Prior to Admission  Medication Sig Dispense Refill Last Dose   Lidocaine 4 % GEL Apply 1 Application topically 3 (three) times daily as needed. 10 g 0    omeprazole (PRILOSEC) 10 MG capsule Take 10 mg by mouth daily.      polyethylene glycol powder (GLYCOLAX/MIRALAX) 17 GM/SCOOP powder Take 17 g by mouth daily. 255 g 0    pramoxine (PROCTOFOAM) 1 % foam Place 1 Application rectally 3 (three) times daily as needed for anal itching. 15 g 0    promethazine (PHENERGAN) 25 MG tablet Take 25 mg  by mouth every 6 (six) hours as needed for nausea or vomiting.      witch hazel-glycerin (TUCKS) pad Apply 1 Application topically as needed for itching. 40 each 0     Review of Systems  Constitutional:  Negative for chills, fatigue and fever.  Eyes:  Negative for pain and visual disturbance.  Respiratory:  Negative for apnea, shortness of breath and wheezing.   Cardiovascular:  Negative for chest pain and palpitations.  Gastrointestinal:  Negative for abdominal pain, constipation, diarrhea, nausea and vomiting.  Genitourinary:  Positive for decreased urine volume and flank pain. Negative for difficulty urinating, dyspareunia, dysuria, hematuria, pelvic pain, vaginal bleeding, vaginal discharge and vaginal pain.  Musculoskeletal:  Positive for back pain.  Neurological:  Negative for dizziness, seizures, weakness, light-headedness and headaches.  Psychiatric/Behavioral:  Negative for suicidal ideas.    Physical Exam   Blood pressure (!) 140/91, pulse (!) 109, temperature 98 F  (36.7 C), temperature source Oral, last menstrual period 02/14/2022, SpO2 99 %.  Physical Exam Vitals and nursing note reviewed.  Constitutional:      General: She is not in acute distress.    Appearance: Normal appearance.  HENT:     Head: Normocephalic.  Cardiovascular:     Rate and Rhythm: Tachycardia present.  Pulmonary:     Effort: Pulmonary effort is normal.  Abdominal:     Palpations: Abdomen is soft.     Tenderness: There is right CVA tenderness. There is no left CVA tenderness.  Musculoskeletal:        General: Tenderness present. Normal range of motion.     Cervical back: Normal range of motion.  Skin:    General: Skin is warm and dry.     Capillary Refill: Capillary refill takes less than 2 seconds.  Neurological:     Mental Status: She is alert and oriented to person, place, and time.  Psychiatric:        Mood and Affect: Mood normal.   Patient Vitals for the past 24 hrs:  BP Temp Temp src Pulse SpO2  06/09/22 2101 -- -- -- -- 100 %  06/09/22 1955 -- -- -- -- 99 %  06/09/22 1950 -- -- -- -- 99 %  06/09/22 1945 -- -- -- -- 90 %  06/09/22 1905 -- 98 F (36.7 C) Oral -- --  06/09/22 1850 -- -- -- -- 100 %  06/09/22 1846 (!) 140/91 -- -- (!) 109 --  06/09/22 1837 (!) 148/96 -- -- (!) 112 100 %  06/09/22 1552 122/73 98.1 F (36.7 C) Oral (!) 125 94 %    FHT obtained in triage  MAU Course  Procedures Orders Placed This Encounter  Procedures   Culture, OB Urine   US RENAL   Urinalysis, Routine w reflex microscopic   CBC with Differential/Platelet   Comprehensive metabolic panel   Insert peripheral IV   Meds ordered this encounter  Medications   lactated ringers bolus 1,000 mL   HYDROmorphone (DILAUDID) injection 1 mg   ondansetron (ZOFRAN) injection 4 mg   lactated ringers bolus 1,000 mL   cyclobenzaprine (FLEXERIL) tablet 10 mg   DISCONTD: cyclobenzaprine (FLEXERIL) tablet 10 mg   cefTRIAXone (ROCEPHIN) 2 g in sodium chloride 0.9 % 100 mL IVPB     Order Specific Question:   Antibiotic Indication:    Answer:   UTI   0.9 %  sodium chloride infusion   ketorolac (TORADOL) injection 60 mg   HYDROmorphone (DILAUDID) injection 1 mg  Results for orders placed or performed during the hospital encounter of 06/09/22 (from the past 24 hour(s))  CBC with Differential/Platelet     Status: None   Collection Time: 06/09/22  4:03 PM  Result Value Ref Range   WBC 8.9 4.0 - 10.5 K/uL   RBC 4.35 3.87 - 5.11 MIL/uL   Hemoglobin 12.8 12.0 - 15.0 g/dL   HCT 24.2 35.3 - 61.4 %   MCV 85.5 80.0 - 100.0 fL   MCH 29.4 26.0 - 34.0 pg   MCHC 34.4 30.0 - 36.0 g/dL   RDW 43.1 54.0 - 08.6 %   Platelets 344 150 - 400 K/uL   nRBC 0.0 0.0 - 0.2 %   Neutrophils Relative % 63 %   Neutro Abs 5.6 1.7 - 7.7 K/uL   Lymphocytes Relative 28 %   Lymphs Abs 2.5 0.7 - 4.0 K/uL   Monocytes Relative 8 %   Monocytes Absolute 0.7 0.1 - 1.0 K/uL   Eosinophils Relative 1 %   Eosinophils Absolute 0.1 0.0 - 0.5 K/uL   Basophils Relative 0 %   Basophils Absolute 0.0 0.0 - 0.1 K/uL   Immature Granulocytes 0 %   Abs Immature Granulocytes 0.02 0.00 - 0.07 K/uL  Comprehensive metabolic panel     Status: Abnormal   Collection Time: 06/09/22  4:03 PM  Result Value Ref Range   Sodium 135 135 - 145 mmol/L   Potassium 3.9 3.5 - 5.1 mmol/L   Chloride 104 98 - 111 mmol/L   CO2 21 (L) 22 - 32 mmol/L   Glucose, Bld 94 70 - 99 mg/dL   BUN 5 (L) 6 - 20 mg/dL   Creatinine, Ser 7.61 0.44 - 1.00 mg/dL   Calcium 9.0 8.9 - 95.0 mg/dL   Total Protein 6.7 6.5 - 8.1 g/dL   Albumin 3.3 (L) 3.5 - 5.0 g/dL   AST 17 15 - 41 U/L   ALT 15 0 - 44 U/L   Alkaline Phosphatase 71 38 - 126 U/L   Total Bilirubin 0.4 0.3 - 1.2 mg/dL   GFR, Estimated >93 >26 mL/min   Anion gap 10 5 - 15  Urinalysis, Routine w reflex microscopic     Status: Abnormal   Collection Time: 06/09/22  6:27 PM  Result Value Ref Range   Color, Urine YELLOW YELLOW   APPearance HAZY (A) CLEAR   Specific Gravity, Urine 1.019  1.005 - 1.030   pH 6.0 5.0 - 8.0   Glucose, UA NEGATIVE NEGATIVE mg/dL   Hgb urine dipstick SMALL (A) NEGATIVE   Bilirubin Urine NEGATIVE NEGATIVE   Ketones, ur 80 (A) NEGATIVE mg/dL   Protein, ur NEGATIVE NEGATIVE mg/dL   Nitrite POSITIVE (A) NEGATIVE   Leukocytes,Ua NEGATIVE NEGATIVE   RBC / HPF 21-50 0 - 5 RBC/hpf   WBC, UA 0-5 0 - 5 WBC/hpf   Bacteria, UA FEW (A) NONE SEEN   Squamous Epithelial / LPF 0-5 0 - 5   Mucus PRESENT    US RENAL  Result Date: 06/09/2022 CLINICAL DATA:  Back pain EXAM: RENAL / URINARY TRACT ULTRASOUND COMPLETE COMPARISON:  None Available. FINDINGS: Right Kidney: Renal measurements: 11.7 x 5.3 x 6.1 cm = volume: 195 6 mL. Echogenicity within normal limits. Mild right hydronephrosis. Left Kidney: Renal measurements: 12.0 x 6.7 x 5.8 cm = volume: 243 mL. Echogenicity within normal limits. No mass or hydronephrosis visualized. Bladder: Appears normal for degree of bladder distention. Postvoid volume is 24.5 cc. Other: None. IMPRESSION: Mild  right hydronephrosis. Electronically Signed   By: Allegra Lai M.D.   On: 06/09/2022 18:33    MDM - Tachycardia and 10/10 pain, IV dilaudid ordered.  @ 1730- Patient feels the urge to pee, but states she cannot go.  - Order to bladder scan, Bladder scan was 45.   - Reassessment at 1800 patient vomiting and pain unrelieved by IV dilaudid. IV Zofran ordered.  - Renal US normal for pregnancy with mild right sided hydronephrosis.  - WBC normal low suspicion for Pyelo.   - PO flexeril ordered for pain - UA positive for Nitrite, UTI present. Reflexed to culture.  - Patient actively vomiting and pain remains 10/10. 2g IV Rocephin ordered  - Consulted Dr. Para March MAU attending on admission for suspicion of kidney stone. Discussed patient presentation and clinical picture with labs and [place] results. Reviewed patient unrelieved pain increase in BP and tachycardia. MD agree with admission and plan to place orders. Per MD recommendation  Toradol 60mg  and 1mg  Dilaudid ordered for pain.  - Upon admitting patient, patient admitting to MAU under wrong practice.  - Call Placed to [facility] on call attending @ 2019. No answer message left.  - Call Placed to [facility] Attending at 2043. Provider in surgery, brief report given over the phone and MD will return MAU call as soon as possible.  - Pain currently a 7/10.   - Report given to 2020, CNM @ 2135. Transfer of care.   Shantonette Sabas Sous) 2136, MSN, CNM  [facility]  06/09/22 9:35 PM    Assessment and Plan  Reassessment (9:53 PM) -Dr. [facility] returns call after coming from surgery.  States patient is [facility] and CNM will be down to admit accordingly. -Nursing staff updated.  06/11/22 MSN, CNM Advanced Practice Provider, [facility]

## 2022-06-09 NOTE — H&P (Incomplete)
OB ADMISSION/ HISTORY & PHYSICAL:  Admission Date: 06/09/2022  3:51 PM  Admit Diagnosis: Kidney stones  Jamie Lawrence is a 21 y.o. female G3P0111 [redacted]w[redacted]d presenting with anal pain. Reports onset 2 days ago. Pain is intermittent, sharp, and cramping. Rates pain 9/10. Has not taken anything for it. Denies rectal bleeding. Reports 1-2 soft BMs daily, had 1 today. Denies sex or anything placed via anus. Pain is worse with sitting, standing, and BMs. Denies abdominal pain or cramping. E.coli noted from new OB. Treatment completed.  History of current pregnancy: G3P0111   Patient entered care with [facility] at 9+3 wks.   EDC by LMP and congruent w/ 9+3 wk U/S.   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   GAD (generalized anxiety disorder) 12/08/2014   Bulimia nervosa 12/08/2014    Prenatal Labs: ABO, Rh: --/--/O POS (11/17 1603) Antibody: NEG (11/17 1603) Rubella:   immune RPR:   NR HBsAg:   NR HIV:   NR GC/CHL: chlamydia positive, treatment provided, needs follow-up test Genetics: low-risk    OB History  Gravida Para Term Preterm AB Living  3 1 0 1 1 1   SAB IAB Ectopic Multiple Live Births  1 0 0 0 1    # Outcome Date GA Lbr Len/2nd Weight Sex Delivery Anes PTL Lv  3 Current           2 Preterm 07/10/17 [redacted]w[redacted]d / 00:06 1460 g M Vag-Spont EPI  LIV     Birth Comments: preterm, IUGR  1 SAB 11/06/14 [redacted]w[redacted]d    SAB       Medical / Surgical History: Past medical history:  Past Medical History:  Diagnosis Date   Anxiety    Bipolar depression (HCC)    Chlamydia infection affecting pregnancy 07/06/2017   tx on 07/04/17   Depression    Eating disorder    GERD (gastroesophageal reflux disease)     Hypertension in pregnancy, preeclampsia, severe, delivered 07/05/2017   Hypomania (HCC)    IBS (irritable bowel syndrome)    Insomnia    Migraines    Miscarriage 09/2014   Mononucleosis    Mood disturbance    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   Pregnancy induced hypertension    UTI (urinary tract infection)    Vomiting     Past surgical history:  Past Surgical History:  Procedure Laterality Date   COLONOSCOPY     ESOPHAGOGASTRODUODENOSCOPY ENDOSCOPY     Family History:  Family History  Problem Relation Age of Onset   Asthma Mother    Hypertension Mother    Hypertension Father    Mental illness Father    Brain cancer Maternal Aunt    Colon cancer Maternal Aunt    Hypertension Maternal Grandmother    Hypertension Paternal Grandmother    Hypertension Paternal Grandfather    Diabetes Paternal Grandfather    Pancreatic cancer Neg Hx    Esophageal cancer Neg Hx     Social History:  reports that she has never smoked. She has been exposed to tobacco smoke. She has never used smokeless tobacco. She reports that she does not drink alcohol and does not use drugs.  Allergies: Patient has no known allergies.   Current Medications  at time of admission:  Prior to Admission medications   Medication Sig Start Date End Date Taking? Authorizing Provider  Lidocaine 4 % GEL Apply 1 Application topically 3 (three) times daily as needed. 06/08/22   Donette Larry, CNM  omeprazole (PRILOSEC) 10 MG capsule Take 10 mg by mouth daily.    [provider]  polyethylene glycol powder (GLYCOLAX/MIRALAX) 17 GM/SCOOP powder Take 17 g by mouth daily. 06/08/22   Donette Larry, CNM  pramoxine (PROCTOFOAM) 1 % foam Place 1 Application rectally 3 (three) times daily as needed for anal itching. 06/08/22   Donette Larry, CNM  promethazine (PHENERGAN) 25 MG tablet Take 25 mg by mouth every 6 (six) hours as needed for nausea or vomiting.    [provider]  witch  hazel-glycerin (TUCKS) pad Apply 1 Application topically as needed for itching. 06/08/22   Donette Larry, CNM  famotidine (PEPCID) 20 MG tablet Take 1 tablet (20 mg total) by mouth daily. 07/13/20 08/17/20  Caccavale, Sophia, PA-C  fluticasone (FLONASE) 50 MCG/ACT nasal spray Place 1 spray into both nostrils daily. 03/28/20 08/17/20  Janace Aris, NP    Review of Systems: Constitutional: Negative   HENT: Negative   Eyes: Negative   Respiratory: Negative   Cardiovascular: Negative   Gastrointestinal: Negative  Genitourinary: neg for bloody show, neg for LOF   Musculoskeletal: Negative   Skin: Negative   Neurological: Negative   Endo/Heme/Allergies: Negative   Psychiatric/Behavioral: Negative    Physical Exam: VS: Blood pressure (!) 140/91, pulse (!) 109, temperature 98 F (36.7 C), temperature source Oral, last menstrual period 02/14/2022, SpO2 100 %. AAO x3, no signs of distress Cardiovascular: RRR Respiratory: Unlabored GU/GI: Abdomen gravid, non-tender, non-distended Extremities: no edema, negative for pain, tenderness, and cords  Cervical exam:Dilation: Closed Effacement (%): Thick Exam by:: Dorathy Daft, CNM FHR: Doppler139   Intake/Output Summary (Last 24 hours) at 06/10/2022 0723 Last data filed at 06/10/2022 0529 Gross per 24 hour  Intake --  Output 950 ml  Net -950 ml       Assessment: 21 y.o. F5D3220 [redacted]w[redacted]d Kidney stones  Plan:  Admit to OBS 23 hr obs FHR doppler Renal US normal for pregnancy with mild right sided hydronephrosis.  UTI    -Rocephin 2G in MAU Pain management    - Dilaudid 1-2 mg IV Q3 PRN N/V    -zofran and compazine LR @ 150 mL Strain urine  Dr Richardson Dopp notified of admission and plan of care  Roma Schanz DNP, CNM 06/09/2022 11:29 PM

## 2022-06-09 NOTE — MAU Note (Addendum)
[name] is a 21 y.o. at [redacted]w[redacted]d here in MAU reporting: by EMS reporting lower right back pain. Pt states she was in the bathroom today having a normal BM and when she got up the pain started. Pt states it is constant and worse with movement. Pt has a hx of kidney stones but pt states this pain feels different than when she had a kidney stone. Pt denies VB or LOF.   Onset of complaint: 06/09/2022 Pain score: 10/10 Vitals:   06/09/22 1552  BP: 122/73  Pulse: (!) 125  Temp: 98.1 F (36.7 C)  SpO2: 94%     FHT: 145 Lab orders placed from triage:

## 2022-06-10 ENCOUNTER — Encounter (HOSPITAL_COMMUNITY): Payer: Self-pay | Admitting: Obstetrics and Gynecology

## 2022-06-10 DIAGNOSIS — R109 Unspecified abdominal pain: Secondary | ICD-10-CM | POA: Diagnosis not present

## 2022-06-10 DIAGNOSIS — N200 Calculus of kidney: Secondary | ICD-10-CM

## 2022-06-10 DIAGNOSIS — N390 Urinary tract infection, site not specified: Secondary | ICD-10-CM | POA: Diagnosis not present

## 2022-06-10 LAB — TYPE AND SCREEN
ABO/RH(D): O POS
Antibody Screen: NEGATIVE

## 2022-06-10 MED ORDER — ONDANSETRON HCL 4 MG/2ML IJ SOLN
4.0000 mg | Freq: Four times a day (QID) | INTRAMUSCULAR | Status: DC | PRN
  Administered 2022-06-10: 4 mg via INTRAVENOUS
  Filled 2022-06-10: qty 2

## 2022-06-10 MED ORDER — OXYCODONE HCL 5 MG PO TABS: 5.0000 mg | ORAL_TABLET | ORAL | 0 refills | Status: DC | PRN

## 2022-06-10 NOTE — Discharge Summary (Signed)
Physician Discharge Summary  Patient ID: Jamie Lawrence MRN: [id number] DOB/AGE: Jan 13, 2001 21 y.o.  Admit date: 06/09/2022 Discharge date: 06/10/2022  Admission Diagnoses:Right flank pain 2 suspected kidney stones 3 Urinary tract infection  Discharge Diagnoses:  Principal Problem:   Kidney pain Active Problems:   Kidney stone complicating pregnancy   Discharged Condition: stable  Hospital Course: pt was admitted for observation after presenting with right sided flank pain. SHe received IV fluids, IV dilaudid , Toradol IM with improvement in her pain. On HD #2 pt states she feels much better and thinks she passed the kidney stone. She denies dysuria. Of noted she was diagnosed with UTI on admission and recived IV rocephin.   Consults: None  Significant Diagnostic Studies: labs:  Results for orders placed or performed during the hospital encounter of 06/09/22 (from the past 24 hour(s))  CBC with Differential/Platelet     Status: None   Collection Time: 06/09/22  4:03 PM  Result Value Ref Range   WBC 8.9 4.0 - 10.5 K/uL   RBC 4.35 3.87 - 5.11 MIL/uL   Hemoglobin 12.8 12.0 - 15.0 g/dL   HCT 38.7 56.4 - 33.2 %   MCV 85.5 80.0 - 100.0 fL   MCH 29.4 26.0 - 34.0 pg   MCHC 34.4 30.0 - 36.0 g/dL   RDW 95.1 88.4 - 16.6 %   Platelets 344 150 - 400 K/uL   nRBC 0.0 0.0 - 0.2 %   Neutrophils Relative % 63 %   Neutro Abs 5.6 1.7 - 7.7 K/uL   Lymphocytes Relative 28 %   Lymphs Abs 2.5 0.7 - 4.0 K/uL   Monocytes Relative 8 %   Monocytes Absolute 0.7 0.1 - 1.0 K/uL   Eosinophils Relative 1 %   Eosinophils Absolute 0.1 0.0 - 0.5 K/uL   Basophils Relative 0 %   Basophils Absolute 0.0 0.0 - 0.1 K/uL   Immature Granulocytes 0 %   Abs Immature Granulocytes 0.02 0.00 - 0.07 K/uL  Comprehensive metabolic panel     Status: Abnormal   Collection Time: 06/09/22  4:03 PM  Result Value Ref Range   Sodium 135 135 - 145 mmol/L   Potassium 3.9 3.5 - 5.1 mmol/L   Chloride 104 98 - 111 mmol/L    CO2 21 (L) 22 - 32 mmol/L   Glucose, Bld 94 70 - 99 mg/dL   BUN 5 (L) 6 - 20 mg/dL   Creatinine, Ser 0.63 0.44 - 1.00 mg/dL   Calcium 9.0 8.9 - 01.6 mg/dL   Total Protein 6.7 6.5 - 8.1 g/dL   Albumin 3.3 (L) 3.5 - 5.0 g/dL   AST 17 15 - 41 U/L   ALT 15 0 - 44 U/L   Alkaline Phosphatase 71 38 - 126 U/L   Total Bilirubin 0.4 0.3 - 1.2 mg/dL   GFR, Estimated >01 >09 mL/min   Anion gap 10 5 - 15  Type and screen [facility]     Status: None   Collection Time: 06/09/22  4:03 PM  Result Value Ref Range   ABO/RH(D) O POS    Antibody Screen NEG    Sample Expiration      06/12/2022,2359 Performed at [facility], [address], [place], [place] [address]   Urinalysis, Routine w reflex microscopic     Status: Abnormal   Collection Time: 06/09/22  6:27 PM  Result Value Ref Range   Color, Urine YELLOW YELLOW   APPearance HAZY (A) CLEAR  Specific Gravity, Urine 1.019 1.005 - 1.030   pH 6.0 5.0 - 8.0   Glucose, UA NEGATIVE NEGATIVE mg/dL   Hgb urine dipstick SMALL (A) NEGATIVE   Bilirubin Urine NEGATIVE NEGATIVE   Ketones, ur 80 (A) NEGATIVE mg/dL   Protein, ur NEGATIVE NEGATIVE mg/dL   Nitrite POSITIVE (A) NEGATIVE   Leukocytes,Ua NEGATIVE NEGATIVE   RBC / HPF 21-50 0 - 5 RBC/hpf   WBC, UA 0-5 0 - 5 WBC/hpf   Bacteria, UA FEW (A) NONE SEEN   Squamous Epithelial / LPF 0-5 0 - 5   Mucus PRESENT      Treatments: IV hydration and analgesia: Dilaudid and toradol  Discharge Exam: Blood pressure 123/74, pulse 100, temperature 98.6 F (37 C), temperature source Oral, resp. rate 18, height 4\' 11"  (1.499 m), weight 68 kg, last menstrual period 02/14/2022, SpO2 100 %. General appearance: alert, cooperative, and no distress Resp: clear to auscultation bilaterally Cardio: regular rate and rhythm, S1, S2 normal, no murmur, click, rub or gallop GI: soft, non-tender; bowel sounds normal; no masses,  no organomegaly Extremities: extremities normal, atraumatic, no  cyanosis or edema  Disposition: Discharge disposition: 01-Home or Self Care       Discharge Instructions     Discharge activity:  No Restrictions   Complete by: As directed    Discharge diet:  No restrictions   Complete by: As directed    No sexual activity restrictions   Complete by: As directed    Notify physician for a general feeling that "something is not right"   Complete by: As directed    Notify physician for increase or change in vaginal discharge   Complete by: As directed    Notify physician for intestinal cramps, with or without diarrhea, sometimes described as "gas pain"   Complete by: As directed    Notify physician for leaking of fluid   Complete by: As directed    Notify physician for low, dull backache, unrelieved by heat or Tylenol   Complete by: As directed    Notify physician for menstrual like cramps   Complete by: As directed    Notify physician for pelvic pressure   Complete by: As directed    Notify physician for uterine contractions.  These may be painless and feel like the uterus is tightening or the baby is  "balling up"   Complete by: As directed    Notify physician for vaginal bleeding   Complete by: As directed    PRETERM LABOR:  Includes any of the follwing symptoms that occur between 20 - [redacted] weeks gestation.  If these symptoms are not stopped, preterm labor can result in preterm delivery, placing your baby at risk   Complete by: As directed       Allergies as of 06/10/2022   No Known Allergies      Medication List     TAKE these medications    Lidocaine 4 % Gel Apply 1 Application topically 3 (three) times daily as needed.   omeprazole 10 MG capsule Commonly known as: PRILOSEC Take 10 mg by mouth daily.   oxyCODONE 5 MG immediate release tablet Commonly known as: Roxicodone Take 1 tablet (5 mg total) by mouth every 4 (four) hours as needed for severe pain.   polyethylene glycol powder 17 GM/SCOOP powder Commonly known as:  GLYCOLAX/MIRALAX Take 17 g by mouth daily.   pramoxine 1 % foam Commonly known as: PROCTOFOAM Place 1 Application rectally 3 (three) times daily as needed  for anal itching.   promethazine 25 MG tablet Commonly known as: PHENERGAN Take 25 mg by mouth every 6 (six) hours as needed for nausea or vomiting.   witch hazel-glycerin pad Commonly known as: TUCKS Apply 1 Application topically as needed for itching.        Follow-up Information     [facility], [facility]. Go on 06/13/2022.   Specialty: Obstetrics and Gynecology Contact information: [address] [place] [address] [phone]                 Signed: Gerald Leitz 06/10/2022, 12:20 PM

## 2022-06-10 NOTE — Progress Notes (Signed)
S: Pain has improved with medication and heat. Flank pain 3/10. Feels fluttering fetal movement. Denies vaginal bleeding and abd cramping. Did not tolerate clear liquids overnight. Vomited twice. Nausea has improved with medication. Will attempt clear liquids for breakfast.   O: Blood pressure 110/62, pulse (!) 103, temperature 98.3 F (36.8 C), temperature source Oral, resp. rate 18, height 4\' 11"  (1.499 m), weight 68 kg, last menstrual period 02/14/2022, SpO2 100 %.  FHR doppler: 139 Phys Exam: Gen: AAO x3 Cardiovascular: RRR Respiratory: Unlabored GU/GI: Abdomen gravid, non-tender, non-distended Extremities: no edema, negative for pain, tenderness, and cords  A/P: 21 y.o. 21 [redacted]w[redacted]d Kidney stones Continue current plan  [redacted]w[redacted]d, DNP, CNM 06/10/2022 7:28 AM

## 2022-06-10 NOTE — Plan of Care (Signed)
  Problem: Education: Goal: Knowledge of disease or condition will improve Outcome: Completed/Met Goal: Knowledge of the prescribed therapeutic regimen will improve Outcome: Completed/Met Goal: Individualized Educational Video(s) Outcome: Completed/Met   Problem: Clinical Measurements: Goal: Complications related to the disease process, condition or treatment will be avoided or minimized Outcome: Completed/Met   Problem: Education: Goal: Knowledge of General Education information will improve Description: Including pain rating scale, medication(s)/side effects and non-pharmacologic comfort measures Outcome: Completed/Met   Problem: Health Behavior/Discharge Planning: Goal: Ability to manage health-related needs will improve Outcome: Completed/Met   Problem: Clinical Measurements: Goal: Ability to maintain clinical measurements within normal limits will improve Outcome: Completed/Met Goal: Will remain free from infection Outcome: Completed/Met Goal: Diagnostic test results will improve Outcome: Completed/Met Goal: Respiratory complications will improve Outcome: Completed/Met Goal: Cardiovascular complication will be avoided Outcome: Completed/Met   Problem: Activity: Goal: Risk for activity intolerance will decrease Outcome: Completed/Met   Problem: Nutrition: Goal: Adequate nutrition will be maintained Outcome: Completed/Met   Problem: Coping: Goal: Level of anxiety will decrease Outcome: Completed/Met   Problem: Elimination: Goal: Will not experience complications related to bowel motility Outcome: Completed/Met Goal: Will not experience complications related to urinary retention Outcome: Completed/Met   Problem: Pain Managment: Goal: General experience of comfort will improve Outcome: Completed/Met   Problem: Safety: Goal: Ability to remain free from injury will improve Outcome: Completed/Met   Problem: Skin Integrity: Goal: Risk for impaired skin  integrity will decrease Outcome: Completed/Met

## 2022-06-12 LAB — CULTURE, OB URINE: Culture: >=100000 — AB

## 2022-06-13 ENCOUNTER — Other Ambulatory Visit: Payer: Self-pay

## 2022-06-13 DIAGNOSIS — N390 Urinary tract infection, site not specified: Secondary | ICD-10-CM

## 2022-06-13 MED ORDER — CEFADROXIL 500 MG PO CAPS: 500.0000 mg | ORAL_CAPSULE | Freq: Two times a day (BID) | ORAL | 0 refills | Status: DC

## 2022-06-23 DIAGNOSIS — Z419 Encounter for procedure for purposes other than remedying health state, unspecified: Secondary | ICD-10-CM | POA: Diagnosis not present

## 2022-07-19 DIAGNOSIS — Z363 Encounter for antenatal screening for malformations: Secondary | ICD-10-CM | POA: Diagnosis not present

## 2022-07-19 DIAGNOSIS — Z3A22 22 weeks gestation of pregnancy: Secondary | ICD-10-CM | POA: Diagnosis not present

## 2022-07-24 DIAGNOSIS — Z419 Encounter for procedure for purposes other than remedying health state, unspecified: Secondary | ICD-10-CM | POA: Diagnosis not present

## 2022-07-24 NOTE — L&D Delivery Note (Signed)
Delivery Note At 5:52 AM a viable female was delivered via Vaginal, Spontaneous (Presentation: Left Occiput Anterior with compound hand presentation).  APGAR: 7, 8; weight 4 lb 13.3 oz (2190 g).   Placenta status: Spontaneous, Intact.  Cord: 3 vessels with the following complications: None.  Cord pH: 7.36  Anesthesia: Epidural Episiotomy: None Lacerations: Periurethral and clitoral Suture Repair:  4-0 Monocryl Est. Blood Loss (mL): 55  Mom to postpartum.  Baby to Couplet care / Skin to Skin.  Jackie Plum 11/01/2022, 6:39 AM

## 2022-07-28 ENCOUNTER — Encounter (HOSPITAL_COMMUNITY): Payer: Self-pay | Admitting: *Deleted

## 2022-07-28 ENCOUNTER — Ambulatory Visit (HOSPITAL_COMMUNITY)
Admission: EM | Admit: 2022-07-28 | Discharge: 2022-07-28 | Disposition: A | Discharge status: Home / Self Care | Payer: Medicaid Other | Attending: Family Medicine | Admitting: Family Medicine

## 2022-07-28 DIAGNOSIS — H539 Unspecified visual disturbance: Secondary | ICD-10-CM

## 2022-07-28 DIAGNOSIS — Z3490 Encounter for supervision of normal pregnancy, unspecified, unspecified trimester: Secondary | ICD-10-CM

## 2022-07-28 DIAGNOSIS — O26892 Other specified pregnancy related conditions, second trimester: Secondary | ICD-10-CM | POA: Diagnosis not present

## 2022-07-28 DIAGNOSIS — Z3A00 Weeks of gestation of pregnancy not specified: Secondary | ICD-10-CM | POA: Diagnosis not present

## 2022-07-28 DIAGNOSIS — M545 Low back pain, unspecified: Secondary | ICD-10-CM

## 2022-07-28 DIAGNOSIS — F419 Anxiety disorder, unspecified: Secondary | ICD-10-CM

## 2022-07-28 LAB — POCT URINALYSIS DIPSTICK, ED / UC
Bilirubin Urine: NEGATIVE
Glucose, UA: NEGATIVE mg/dL
Ketones, ur: 15 mg/dL — AB
Leukocytes,Ua: NEGATIVE
Nitrite: NEGATIVE
Protein, ur: NEGATIVE mg/dL
Specific Gravity, Urine: 1.01 (ref 1.005–1.030)
Urobilinogen, UA: 0.2 mg/dL (ref 0.0–1.0)
pH: 7 (ref 5.0–8.0)

## 2022-07-28 LAB — CBG MONITORING, ED: Glucose-Capillary: 71 mg/dL (ref 70–99)

## 2022-07-28 MED ORDER — LIDOCAINE 4 % EX PTCH: 1.0000 | MEDICATED_PATCH | CUTANEOUS | 0 refills | Status: DC

## 2022-07-28 NOTE — ED Triage Notes (Signed)
Pt states that she is 6 months pregnant and having back pain which started yesterday, she tried to take tylenol but it made her body feel numb.  She did note that the back pain is worse when she talks, laughs or moves.    She had blurry vision earlier today but it has resolved she said that she has been seen by neuro and told she has migraines but she denies any headaches recently.   She doe state she is having a lot of anxiety about the pregnancy and having the baby.

## 2022-07-28 NOTE — ED Provider Notes (Signed)
[facility]    CSN: [id number] Arrival date & time: 07/28/22  1332      History   Chief Complaint Chief Complaint  Patient presents with   Visual Field Change   Back Pain    HPI Jamie Lawrence is a 22 y.o. female.   23 year old female who is currently 6 months pregnant presents today due to concerns of 2 episodes of blurring of vision around 1:00 this afternoon while staring at her phone, and back pain starting yesterday. Patient states she was looking at something on her phone around 1:00 this afternoon when she felt like her bilateral eyes went slightly blurry.  She states it lasted for few seconds and then resolved.  She states it happened again and lasted for a minute and then resolved.  She is not actively having any symptoms.  She does have a history of migraines with aura, but denies any migraine actively.  She does endorse a little bit of sinus congestion.  No fever.  No cough.  Additionally, patient states she started having like a crampy spasm back pain yesterday.  It starts in her thoracic region and extends down to the lumbar region.  She denies any vaginal discharge.  No flank pain or urinary symptoms.  She denies any change in fetal movement, and denies any contractions.  She states the pain is reproducible to palpation and feels it is muscular in nature.  She did try a Tylenol but felt it was ineffective. Does admit to not drinking much water.  Lastly, patient is concerned about her anxiety.  She states she had anxiety prior to getting pregnant, but felt it was well-controlled.  Does have a prior history of using Lexapro, but states she is not currently taking anything.  She feels that over the past week, she has become anxious about "everything".  When she gets these anxious episodes, she reports feeling shaky, muscular tension, and occasionally vision changes.  She denies weight loss, tachycardia or palpitations.  No chest pain.  She has not yet addressed this  issue with her PCP or OB. Patient is currently taking as needed Zofran and pantoprazole.   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   Pregnancy induced hypertension    UTI (urinary tract infection)    Vomiting     Patient Active Problem List   Diagnosis Date Noted   Kidney stone complicating pregnancy 78/29/5621   Kidney pain 06/09/2022   Lower urinary tract infectious disease 07/29/2019   Chlamydia infection 08/23/2017   Depression    Panic attacks 07/07/2017   MDD (major depressive disorder), single episode, severe , no psychosis ([place]) 12/08/2014   GAD (generalized anxiety disorder) 12/08/2014   Bulimia nervosa 12/08/2014    Past Surgical History:  Procedure Laterality Date   COLONOSCOPY     ESOPHAGOGASTRODUODENOSCOPY ENDOSCOPY      OB History     Gravida  3   Para  1   Term  0  Preterm  1   AB  1   Living  1      SAB  1   IAB  0   Ectopic  0   Multiple  0   Live Births  1            Home Medications    Prior to Admission medications   Medication Sig Start Date End Date Taking? Authorizing Provider  lidocaine 4 % Place 1 patch onto the skin daily. Leave on for 12 hours or less 07/28/22  Yes Marla Pouliot L, PA  pantoprazole (PROTONIX) 40 MG tablet Take 40 mg by mouth daily.   Yes [provider]  famotidine (PEPCID) 20 MG tablet Take 1 tablet (20 mg total) by mouth daily. 07/13/20 08/17/20  Caccavale, Sophia, PA-C  fluticasone (FLONASE) 50 MCG/ACT nasal spray Place 1 spray  into both nostrils daily. 03/28/20 08/17/20  Orvan July, NP    Family History Family History  Problem Relation Age of Onset   Asthma Mother    Hypertension Mother    Hypertension Father    Mental illness Father    Brain cancer Maternal Aunt    Colon cancer Maternal Aunt    Hypertension Maternal Grandmother    Hypertension Paternal Grandmother    Hypertension Paternal Grandfather    Diabetes Paternal Grandfather    Pancreatic cancer Neg Hx    Esophageal cancer Neg Hx     Social History Social History   Tobacco Use   Smoking status: Never    Passive exposure: Yes   Smokeless tobacco: Never  Vaping Use   Vaping Use: Never used  Substance Use Topics   Alcohol use: No    Alcohol/week: 0.0 standard drinks of alcohol   Drug use: No     Allergies   Patient has no known allergies.   Review of Systems Review of Systems  Musculoskeletal:  Positive for back pain.  As per HPI   Physical Exam Triage Vital Signs ED Triage Vitals  Enc Vitals Group     BP 07/28/22 1547 (!) 152/82     Pulse Rate 07/28/22 1547 (!) 103     Resp 07/28/22 1547 18     Temp 07/28/22 1547 98.5 F (36.9 C)     Temp Source 07/28/22 1547 Oral     SpO2 07/28/22 1547 97 %     Weight --      Height --      Head Circumference --      Peak Flow --      Pain Score 07/28/22 1545 8     Pain Loc --      Pain Edu? --      Excl. in [place]? --    No data found.  Updated Vital Signs BP (!) 152/82 (BP Location: Left Arm)   Pulse (!) 103   Temp 98.5 F (36.9 C) (Oral)   Resp 18   LMP 02/14/2022   SpO2 97%   Visual Acuity Right Eye Distance: 20/20 Left Eye Distance: 20/20 Bilateral Distance: 20/20  Right Eye Near:   Left Eye Near:    Bilateral Near:     Physical Exam Vitals and nursing note reviewed.  Constitutional:      General: She is not in acute distress.    Appearance: Normal appearance. She is normal weight. She is not ill-appearing, toxic-appearing or diaphoretic.  HENT:     Head:  Normocephalic and atraumatic.     Right Ear: External ear  normal.     Left Ear: External ear normal.  Eyes:     General: Lids are normal. Vision grossly intact. Gaze aligned appropriately. No visual field deficit.       Right eye: No foreign body, discharge or hordeolum.        Left eye: No foreign body, discharge or hordeolum.     Extraocular Movements: Extraocular movements intact.     Right eye: Normal extraocular motion and no nystagmus.     Left eye: Normal extraocular motion and no nystagmus.     Conjunctiva/sclera: Conjunctivae normal.     Right eye: Right conjunctiva is not injected. No chemosis, exudate or hemorrhage.    Left eye: Left conjunctiva is not injected. No chemosis, exudate or hemorrhage.    Pupils: Pupils are equal, round, and reactive to light. Pupils are equal.  Cardiovascular:     Rate and Rhythm: Normal rate and regular rhythm.     Heart sounds: No murmur heard.    Comments: Recheck pulse 86, normal Pulmonary:     Effort: Pulmonary effort is normal. No respiratory distress.     Breath sounds: Normal breath sounds. No stridor. No wheezing or rhonchi.  Musculoskeletal:        General: Tenderness (reproducible muscular tenderness primarily to the T11-L1 region bilaterally) present. No swelling. Normal range of motion.     Cervical back: Normal range of motion and neck supple. No rigidity or tenderness.  Lymphadenopathy:     Cervical: No cervical adenopathy.  Skin:    General: Skin is warm and dry.     Capillary Refill: Capillary refill takes less than 2 seconds.     Findings: No erythema, lesion or rash.  Neurological:     General: No focal deficit present.     Mental Status: She is alert and oriented to person, place, and time.      UC Treatments / Results  Labs (all labs ordered are listed, but only abnormal results are displayed) Labs Reviewed  POCT URINALYSIS DIPSTICK, ED / UC - Abnormal; Notable for the following components:      Result Value    Ketones, ur 15 (*)    Hgb urine dipstick TRACE (*)    All other components within normal limits  CBG MONITORING, ED    EKG   Radiology No results found.  Procedures Procedures (including critical care time)  Medications Ordered in UC Medications - No data to display  Initial Impression / Assessment and Plan / UC Course  I have reviewed the triage vital signs and the nursing notes.  Pertinent labs & imaging results that were available during my care of the patient were reviewed by me and considered in my medical decision making (see chart for details).  Clinical Course as of 07/28/22 1657  Fri Jul 28, 2022  1614 Pulse rate 86 [WC]  1615 Recheck BP:  [WC]  1616 Recheck BP 124/81 [WC]    Clinical Course User Index [WC] Sema Stangler L, PA    Back pain -UA negative, no signs of infection.  Due to the reproducible nature I suspect this to be musculoskeletal in nature.  Recommended patient increase hydration to help prevent any cramps or spasms particularly while pregnant.  May continue Tylenol, no additional over-the-counter medication recommended.  Will give topical lidocaine as this is also considered safe in pregnancy. Anxiety -patient to follow-up with PCP to discuss treatment options.  Recommended patient consider getting a TSH drawn given that acute onset 1 week  ago.  Patient's vital signs are stable Visual disturbance -this was a single episode, has resolved.  Visual acuity in office normal, no abnormal findings on exam.  Glucose normal. Pregnancy -if any new or worsening symptoms, please head to the [facility] ER.  Please give OB call first thing on Monday to schedule follow-up.   Final Clinical Impressions(s) / UC Diagnoses   Final diagnoses:  Acute bilateral low back pain without sciatica  Anxiety  Vision disturbance  Pregnancy, unspecified gestational age     Discharge Instructions      Part of your back pain may be related to dehydration.  Please  increase your water consumption to prevent muscle cramps and spasms. Your urine does not show evidence of infection. You may continue to take Tylenol on an as-needed basis, you cannot take any anti-inflammatories such as Motrin or Advil. I have prescribed a topical lidocaine patch for you to try.  You can leave this on for up to 12 hours, must have 12-hours free  Regarding your vision, please decrease the brightness of your screen.  Please also limit screen time to no more than a few hours a day. Your glucose was normal and your visual acuity was also normal.  Please call your OB and schedule a follow-up to discuss your anxiety.  You may need to have thyroid testing performed. Please let them know you were here today and schedule a follow up first of next week.    ED Prescriptions     Medication Sig Dispense Auth. Provider   lidocaine 4 % Place 1 patch onto the skin daily. Leave on for 12 hours or less 10 patch Sariya Trickey L, PA      PDMP not reviewed this encounter.   Maretta Bees, [place] 07/28/22 1659

## 2022-07-28 NOTE — Discharge Instructions (Addendum)
Part of your back pain may be related to dehydration.  Please increase your water consumption to prevent muscle cramps and spasms. Your urine does not show evidence of infection. You may continue to take Tylenol on an as-needed basis, you cannot take any anti-inflammatories such as Motrin or Advil. I have prescribed a topical lidocaine patch for you to try.  You can leave this on for up to 12 hours, must have 12-hours free  Regarding your vision, please decrease the brightness of your screen.  Please also limit screen time to no more than a few hours a day. Your glucose was normal and your visual acuity was also normal.  Please call your OB and schedule a follow-up to discuss your anxiety.  You may need to have thyroid testing performed. Please let them know you were here today and schedule a follow up first of next week.

## 2022-08-01 DIAGNOSIS — F419 Anxiety disorder, unspecified: Secondary | ICD-10-CM | POA: Diagnosis not present

## 2022-08-01 DIAGNOSIS — Z3A24 24 weeks gestation of pregnancy: Secondary | ICD-10-CM | POA: Diagnosis not present

## 2022-08-01 DIAGNOSIS — R42 Dizziness and giddiness: Secondary | ICD-10-CM | POA: Diagnosis not present

## 2022-08-16 DIAGNOSIS — Z363 Encounter for antenatal screening for malformations: Secondary | ICD-10-CM | POA: Diagnosis not present

## 2022-08-16 DIAGNOSIS — Z3A26 26 weeks gestation of pregnancy: Secondary | ICD-10-CM | POA: Diagnosis not present

## 2022-08-24 DIAGNOSIS — Z419 Encounter for procedure for purposes other than remedying health state, unspecified: Secondary | ICD-10-CM | POA: Diagnosis not present

## 2022-09-14 DIAGNOSIS — F419 Anxiety disorder, unspecified: Secondary | ICD-10-CM | POA: Diagnosis not present

## 2022-09-14 DIAGNOSIS — Z0000 Encounter for general adult medical examination without abnormal findings: Secondary | ICD-10-CM | POA: Diagnosis not present

## 2022-09-14 DIAGNOSIS — Z3A30 30 weeks gestation of pregnancy: Secondary | ICD-10-CM | POA: Diagnosis not present

## 2022-09-19 DIAGNOSIS — Z3A31 31 weeks gestation of pregnancy: Secondary | ICD-10-CM | POA: Diagnosis not present

## 2022-09-19 DIAGNOSIS — O2341 Unspecified infection of urinary tract in pregnancy, first trimester: Secondary | ICD-10-CM | POA: Diagnosis not present

## 2022-09-19 DIAGNOSIS — Z23 Encounter for immunization: Secondary | ICD-10-CM | POA: Diagnosis not present

## 2022-09-19 DIAGNOSIS — F32A Depression, unspecified: Secondary | ICD-10-CM | POA: Diagnosis not present

## 2022-09-19 DIAGNOSIS — F419 Anxiety disorder, unspecified: Secondary | ICD-10-CM | POA: Diagnosis not present

## 2022-09-19 DIAGNOSIS — Z8759 Personal history of other complications of pregnancy, childbirth and the puerperium: Secondary | ICD-10-CM | POA: Diagnosis not present

## 2022-09-19 DIAGNOSIS — A749 Chlamydial infection, unspecified: Secondary | ICD-10-CM | POA: Diagnosis not present

## 2022-09-19 DIAGNOSIS — F319 Bipolar disorder, unspecified: Secondary | ICD-10-CM | POA: Diagnosis not present

## 2022-09-19 DIAGNOSIS — Z87442 Personal history of urinary calculi: Secondary | ICD-10-CM | POA: Diagnosis not present

## 2022-09-19 DIAGNOSIS — R112 Nausea with vomiting, unspecified: Secondary | ICD-10-CM | POA: Diagnosis not present

## 2022-09-19 DIAGNOSIS — K219 Gastro-esophageal reflux disease without esophagitis: Secondary | ICD-10-CM | POA: Diagnosis not present

## 2022-09-22 DIAGNOSIS — Z419 Encounter for procedure for purposes other than remedying health state, unspecified: Secondary | ICD-10-CM | POA: Diagnosis not present

## 2022-10-03 DIAGNOSIS — O2341 Unspecified infection of urinary tract in pregnancy, first trimester: Secondary | ICD-10-CM | POA: Diagnosis not present

## 2022-10-03 DIAGNOSIS — Z369 Encounter for antenatal screening, unspecified: Secondary | ICD-10-CM | POA: Diagnosis not present

## 2022-10-03 DIAGNOSIS — Z113 Encounter for screening for infections with a predominantly sexual mode of transmission: Secondary | ICD-10-CM | POA: Diagnosis not present

## 2022-10-03 DIAGNOSIS — F419 Anxiety disorder, unspecified: Secondary | ICD-10-CM | POA: Diagnosis not present

## 2022-10-03 DIAGNOSIS — F32A Depression, unspecified: Secondary | ICD-10-CM | POA: Diagnosis not present

## 2022-10-03 DIAGNOSIS — K219 Gastro-esophageal reflux disease without esophagitis: Secondary | ICD-10-CM | POA: Diagnosis not present

## 2022-10-03 DIAGNOSIS — Z8759 Personal history of other complications of pregnancy, childbirth and the puerperium: Secondary | ICD-10-CM | POA: Diagnosis not present

## 2022-10-03 DIAGNOSIS — Z3A33 33 weeks gestation of pregnancy: Secondary | ICD-10-CM | POA: Diagnosis not present

## 2022-10-03 DIAGNOSIS — F319 Bipolar disorder, unspecified: Secondary | ICD-10-CM | POA: Diagnosis not present

## 2022-10-03 DIAGNOSIS — A749 Chlamydial infection, unspecified: Secondary | ICD-10-CM | POA: Diagnosis not present

## 2022-10-06 ENCOUNTER — Other Ambulatory Visit: Payer: Self-pay

## 2022-10-06 ENCOUNTER — Ambulatory Visit: Payer: Self-pay

## 2022-10-06 ENCOUNTER — Encounter (HOSPITAL_COMMUNITY): Payer: Self-pay | Admitting: Obstetrics and Gynecology

## 2022-10-06 ENCOUNTER — Inpatient Hospital Stay (HOSPITAL_COMMUNITY)
Admission: AD | Admit: 2022-10-06 | Discharge: 2022-10-07 | Disposition: A | Discharge status: Home / Self Care | Payer: Medicaid Other | Attending: Obstetrics and Gynecology | Admitting: Obstetrics and Gynecology

## 2022-10-06 DIAGNOSIS — O26893 Other specified pregnancy related conditions, third trimester: Secondary | ICD-10-CM | POA: Diagnosis not present

## 2022-10-06 DIAGNOSIS — O133 Gestational [pregnancy-induced] hypertension without significant proteinuria, third trimester: Secondary | ICD-10-CM

## 2022-10-06 DIAGNOSIS — O3433 Maternal care for cervical incompetence, third trimester: Secondary | ICD-10-CM | POA: Insufficient documentation

## 2022-10-06 DIAGNOSIS — O132 Gestational [pregnancy-induced] hypertension without significant proteinuria, second trimester: Secondary | ICD-10-CM | POA: Insufficient documentation

## 2022-10-06 DIAGNOSIS — Z3A33 33 weeks gestation of pregnancy: Secondary | ICD-10-CM | POA: Insufficient documentation

## 2022-10-06 DIAGNOSIS — O4703 False labor before 37 completed weeks of gestation, third trimester: Secondary | ICD-10-CM

## 2022-10-06 DIAGNOSIS — O1493 Unspecified pre-eclampsia, third trimester: Secondary | ICD-10-CM | POA: Diagnosis not present

## 2022-10-06 DIAGNOSIS — O479 False labor, unspecified: Secondary | ICD-10-CM | POA: Diagnosis not present

## 2022-10-06 LAB — COMPREHENSIVE METABOLIC PANEL
ALT: 12 U/L (ref 0–44)
AST: 18 U/L (ref 15–41)
Albumin: 3 g/dL — ABNORMAL LOW (ref 3.5–5.0)
Alkaline Phosphatase: 178 U/L — ABNORMAL HIGH (ref 38–126)
Anion gap: 11 (ref 5–15)
BUN: 5 mg/dL — ABNORMAL LOW (ref 6–20)
CO2: 21 mmol/L — ABNORMAL LOW (ref 22–32)
Calcium: 8.8 mg/dL — ABNORMAL LOW (ref 8.9–10.3)
Chloride: 103 mmol/L (ref 98–111)
Creatinine, Ser: 0.54 mg/dL (ref 0.44–1.00)
GFR, Estimated: >60 mL/min (ref >60)
Glucose, Bld: 83 mg/dL (ref 70–99)
Potassium: 3.8 mmol/L (ref 3.5–5.1)
Sodium: 135 mmol/L (ref 135–145)
Total Bilirubin: 0.3 mg/dL (ref 0.3–1.2)
Total Protein: 6.4 g/dL — ABNORMAL LOW (ref 6.5–8.1)

## 2022-10-06 LAB — CBC WITH DIFFERENTIAL/PLATELET
Abs Immature Granulocytes: 0.03 10*3/uL (ref 0.00–0.07)
Basophils Absolute: 0 10*3/uL (ref 0.0–0.1)
Basophils Relative: 0 %
Eosinophils Absolute: 0.1 10*3/uL (ref 0.0–0.5)
Eosinophils Relative: 1 %
HCT: 36.1 % (ref 36.0–46.0)
Hemoglobin: 12 g/dL (ref 12.0–15.0)
Immature Granulocytes: 0 %
Lymphocytes Relative: 32 %
Lymphs Abs: 2.7 10*3/uL (ref 0.7–4.0)
MCH: 27.9 pg (ref 26.0–34.0)
MCHC: 33.2 g/dL (ref 30.0–36.0)
MCV: 84 fL (ref 80.0–100.0)
Monocytes Absolute: 0.7 10*3/uL (ref 0.1–1.0)
Monocytes Relative: 9 %
Neutro Abs: 5 10*3/uL (ref 1.7–7.7)
Neutrophils Relative %: 58 %
Platelets: 357 10*3/uL (ref 150–400)
RBC: 4.3 MIL/uL (ref 3.87–5.11)
RDW: 13.7 % (ref 11.5–15.5)
WBC: 8.5 10*3/uL (ref 4.0–10.5)
nRBC: 0 % (ref 0.0–0.2)

## 2022-10-06 LAB — PROTEIN / CREATININE RATIO, URINE
Creatinine, Urine: 112 mg/dL
Protein Creatinine Ratio: 0.13 mg/mg{Cre} (ref 0.00–0.15)
Total Protein, Urine: 15 mg/dL

## 2022-10-06 LAB — URINALYSIS, ROUTINE W REFLEX MICROSCOPIC
Bilirubin Urine: NEGATIVE
Glucose, UA: NEGATIVE mg/dL
Hgb urine dipstick: NEGATIVE
Ketones, ur: 20 mg/dL — AB
Nitrite: NEGATIVE
Protein, ur: NEGATIVE mg/dL
Specific Gravity, Urine: 1.018 (ref 1.005–1.030)
pH: 7 (ref 5.0–8.0)

## 2022-10-06 LAB — WET PREP, GENITAL
Clue Cells Wet Prep HPF POC: NONE SEEN
Sperm: NONE SEEN
Trich, Wet Prep: NONE SEEN
WBC, Wet Prep HPF POC: <10 (ref <10)
Yeast Wet Prep HPF POC: NONE SEEN

## 2022-10-06 LAB — HIV ANTIBODY (ROUTINE TESTING W REFLEX): HIV Screen 4th Generation wRfx: NONREACTIVE

## 2022-10-06 LAB — FETAL FIBRONECTIN: Fetal Fibronectin: NEGATIVE

## 2022-10-06 MED ORDER — LABETALOL HCL 5 MG/ML IV SOLN: 40.0000 mg | INTRAVENOUS | Status: DC | PRN

## 2022-10-06 MED ORDER — NIFEDIPINE 10 MG PO CAPS
10.0000 mg | ORAL_CAPSULE | ORAL | Status: AC
  Administered 2022-10-06 (×2): 10 mg via ORAL
  Filled 2022-10-06 (×2): qty 1

## 2022-10-06 MED ORDER — LACTATED RINGERS BOLUS PEDS: 1000.0000 mL | Freq: Once | INTRAVENOUS | Status: DC

## 2022-10-06 MED ORDER — NIFEDIPINE 10 MG PO CAPS: 20.0000 mg | ORAL_CAPSULE | ORAL | Status: DC | PRN

## 2022-10-06 MED ORDER — NIFEDIPINE 10 MG PO CAPS
10.0000 mg | ORAL_CAPSULE | ORAL | Status: DC | PRN
  Administered 2022-10-06: 10 mg via ORAL
  Filled 2022-10-06: qty 1

## 2022-10-06 MED ORDER — BETAMETHASONE SOD PHOS & ACET 6 (3-3) MG/ML IJ SUSP
12.0000 mg | INTRAMUSCULAR | Status: DC
  Administered 2022-10-06: 12 mg via INTRAMUSCULAR
  Filled 2022-10-06: qty 5

## 2022-10-06 MED ORDER — LACTATED RINGERS IV BOLUS
1000.0000 mL | Freq: Once | INTRAVENOUS | Status: AC
  Administered 2022-10-06: 1000 mL via INTRAVENOUS

## 2022-10-06 NOTE — MAU Note (Signed)
Jamie Lawrence is a 22 y.o. at [redacted]w[redacted]d here in MAU reporting: she's been having tightness and pressure in abdomen since 1400 this afternoon.  States tightening occurs every 6 minutes.  Denies VB or LOF.  Endorses +FM. LMP: NA Onset of complaint: today Pain score: 7 Vitals:   10/06/22 1755  BP: (!) 141/93  Pulse: (!) 103  Resp: 18  Temp: 99.1 F (37.3 C)  SpO2: 100%     FHT:142 bpm Lab orders placed from triage:   UA

## 2022-10-06 NOTE — MAU Provider Note (Signed)
History     CSN: [id number]  Arrival date and time: 10/06/22 1738   Event Date/Time   First Provider Initiated Contact with Patient 10/06/22 1827      No chief complaint on file.  Jamie Lawrence is a 22 y.o. [phone] at [redacted]w[redacted]d who presents today with contractions. She states that she started having contractions around 1400 today. Initially it was just tightening, but as the day has progressed the contractions have become more frequent and painful. She denies any VB or  LOF. She reports normal fetal movement. She denies any complications with this pregnancy. Her last pregnancy resulted in a preterm delivery at 33 weeks 3 days. She denies any HA, visual disturbance or RUQ pain.     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   Pregnancy induced hypertension    UTI (urinary tract infection)    Vomiting     Past Surgical History:  Procedure Laterality Date   COLONOSCOPY     ESOPHAGOGASTRODUODENOSCOPY ENDOSCOPY      Family History  Problem Relation Age of Onset   Asthma Mother    Hypertension Mother    Asthma Father    Hypertension Father    Mental illness Father    Brain cancer Maternal Aunt    Colon cancer Maternal Aunt    Hypertension Maternal Grandmother    Hypertension Paternal Grandmother    Hypertension Paternal Grandfather    Diabetes Paternal  Grandfather    Pancreatic cancer Neg Hx    Esophageal cancer Neg Hx     Social History   Tobacco Use   Smoking status: Never    Passive exposure: Yes   Smokeless tobacco: Never  Vaping Use   Vaping Use: Never used  Substance Use Topics   Alcohol use: No    Alcohol/week: 0.0 standard drinks of alcohol   Drug use: No    Allergies: No Known Allergies  Medications Prior to Admission  Medication Sig Dispense Refill Last Dose   lidocaine 4 % Place 1 patch onto the skin daily. Leave on for 12 hours or less 10 patch 0    pantoprazole (PROTONIX) 40 MG tablet Take 40 mg by mouth daily.       Review of Systems  All other systems reviewed and are negative.  Physical Exam   Blood pressure 129/79, pulse 100, temperature 99.1 F (37.3 C), temperature source Oral, resp. rate 18, height 4\' 11"  (1.499 m), weight 70.1 kg, last menstrual period 02/14/2022, SpO2 97 %.  Physical Exam Constitutional:      Appearance: She is well-developed.  HENT:     Head: Normocephalic.  Eyes:     Pupils: Pupils are equal, round, and reactive to light.  Cardiovascular:     Rate and Rhythm: Normal rate and regular rhythm.     Heart sounds: Normal heart sounds.  Pulmonary:     Effort: Pulmonary effort is normal. No respiratory distress.     Breath sounds: Normal breath sounds.  Abdominal:     Palpations: Abdomen is soft.     Tenderness: There is no abdominal tenderness.  Genitourinary:    Vagina: No bleeding. Vaginal discharge: mucusy.    Comments: External: no lesion Vagina: small amount of white discharge Dilation: 1 Effacement (%): 50 Station: Ballotable Presentation: Undeterminable Exam by:: Carlton Adam, CNM   Musculoskeletal:        General: Normal range of motion.     Cervical back: Normal range of motion and neck supple.  Skin:    General: Skin is warm and dry.  Neurological:     Mental Status: She is alert and oriented to person, place, and time.  Psychiatric:        Mood and  Affect: Mood normal.        Behavior: Behavior normal.     Patient Vitals for the past 24 hrs:  BP Temp Temp src Pulse Resp SpO2 Height Weight  10/06/22 2145 -- -- -- -- -- 97 % -- --  10/06/22 2140 -- -- -- -- -- 99 % -- --  10/06/22 2139 129/79 -- -- -- -- -- -- --  10/06/22 2135 -- -- -- -- -- 98 % -- --  10/06/22 2130 129/79 -- -- 100 -- 98 % -- --  10/06/22 2125 -- -- -- -- -- 98 % -- --  10/06/22 2120 -- -- -- -- -- 98 % -- --  10/06/22 2115 135/75 -- -- (!) 105 -- 98 % -- --  10/06/22 2111 (!) 140/93 -- -- -- -- -- -- --  10/06/22 2110 -- -- -- -- -- 98 % -- --  10/06/22 2105 -- -- -- -- -- 98 % -- --  10/06/22 2050 -- -- -- -- -- 99 % -- --  10/06/22 2045 (!) 140/93 -- -- (!) 103 -- 98 % -- --  10/06/22 2040 -- -- -- -- -- 98 % -- --  10/06/22 2035 -- -- -- -- -- 99 % -- --  10/06/22 2030 (!) 141/82 -- -- (!) 111 -- 99 % -- --  10/06/22 2015 (!) 144/88 -- -- (!) 125 -- 100 % -- --  10/06/22 2010 -- -- -- -- -- 100 % -- --  10/06/22 1955 -- -- -- -- -- 100 % -- --  10/06/22 1950 -- -- -- -- -- 100 % -- --  10/06/22 1945 (!) 144/95 -- -- 92 -- 100 % -- --  10/06/22 1940 -- -- -- -- -- 100 % -- --  10/06/22 1937 (!) 142/91 -- -- -- -- -- -- --  10/06/22 1935 -- -- -- -- -- 100 % -- --  10/06/22 1930 (!) 142/91 -- -- 93 -- 99 % -- --  10/06/22 1925 -- -- -- -- -- 100 % -- --  10/06/22 1920 -- -- -- -- -- 100 % -- --  10/06/22 1915 (!) 142/93 -- -- (!) 103 -- -- -- --  10/06/22 1900 (!) 154/103 -- -- (!) 109 -- -- -- --  10/06/22 1845 (!) 162/99 -- -- (!) 101 -- 99 % -- --  10/06/22 1835 -- -- -- -- -- 99 % -- --  10/06/22 1830 (!) 146/94 -- -- 98 -- 98 % -- --  10/06/22 1825 -- -- -- -- -- 99 % -- --  10/06/22 1820 -- -- -- -- -- 99 % -- --  10/06/22 1816 (!) 134/92 -- -- 98 -- 98 % -- --  10/06/22 1815 -- -- -- -- -- 98 % -- --  10/06/22 1810 (!) 141/89 -- -- 98 -- 98 % -- --  10/06/22 1755 (!) 141/93 99.1 F (37.3 C) Oral (!) 103 18 100 % -- --  10/06/22 1748 -- --  -- -- -- -- 4\' 11"  (1.499 m) 70.1 kg    Pt informed that the ultrasound is considered a limited OB ultrasound and is not intended to be a complete ultrasound exam.  Patient also informed that the ultrasound is not being completed with the intent of assessing for fetal or placental anomalies or any pelvic abnormalities.  Explained that the purpose of today's ultrasound is to assess for  presentation.  Patient acknowledges the purpose of the exam and the limitations of the study.   VERTEX   Results for orders placed or performed during the hospital encounter of 10/06/22 (from the past 24 hour(s))  Fetal fibronectin     Status: None   Collection Time: 10/06/22  6:32 PM  Result Value Ref Range   Fetal Fibronectin NEGATIVE NEGATIVE  Wet prep, genital     Status: None   Collection Time: 10/06/22  6:37 PM   Specimen: PATH Cytology Cervicovaginal Ancillary Only  Result Value Ref Range   Yeast Wet Prep HPF POC NONE SEEN NONE SEEN   Trich, Wet Prep NONE SEEN NONE SEEN   Clue Cells Wet Prep HPF POC NONE SEEN NONE SEEN   WBC, Wet Prep HPF POC <10 <10   Sperm NONE SEEN   Urinalysis, Routine w reflex microscopic -Urine, Clean Catch     Status: Abnormal   Collection Time: 10/06/22  6:43 PM  Result Value Ref Range   Color, Urine YELLOW YELLOW   APPearance HAZY (A) CLEAR   Specific Gravity, Urine 1.018 1.005 - 1.030   pH 7.0 5.0 - 8.0   Glucose, UA NEGATIVE NEGATIVE mg/dL   Hgb urine dipstick NEGATIVE NEGATIVE   Bilirubin Urine NEGATIVE NEGATIVE   Ketones, ur 20 (A) NEGATIVE mg/dL   Protein, ur NEGATIVE NEGATIVE mg/dL   Nitrite NEGATIVE NEGATIVE   Leukocytes,Ua MODERATE (A) NEGATIVE   RBC / HPF 0-5 0 - 5 RBC/hpf   WBC, UA 0-5 0 - 5 WBC/hpf   Bacteria, UA RARE (A) NONE SEEN   Squamous Epithelial / HPF 6-10 0 - 5 /HPF   Mucus PRESENT   Protein / creatinine ratio, urine     Status: None   Collection Time: 10/06/22  6:43 PM  Result Value Ref Range   Creatinine, Urine 112 mg/dL   Total Protein,  Urine 15 mg/dL   Protein Creatinine Ratio 0.13 0.00 - 0.15 mg/mg[Cre]  Comprehensive metabolic panel     Status: Abnormal   Collection Time: 10/06/22  7:09 PM  Result Value Ref Range   Sodium 135 135 - 145 mmol/L   Potassium 3.8 3.5 - 5.1 mmol/L   Chloride 103 98 - 111 mmol/L   CO2 21 (L) 22 - 32 mmol/L   Glucose, Bld 83 70 - 99 mg/dL   BUN 5 (L) 6 - 20 mg/dL   Creatinine, Ser 0.54 0.44 - 1.00  mg/dL   Calcium 8.8 (L) 8.9 - 10.3 mg/dL   Total Protein 6.4 (L) 6.5 - 8.1 g/dL   Albumin 3.0 (L) 3.5 - 5.0 g/dL   AST 18 15 - 41 U/L   ALT 12 0 - 44 U/L   Alkaline Phosphatase 178 (H) 38 - 126 U/L   Total Bilirubin 0.3 0.3 - 1.2 mg/dL   GFR, Estimated >60 >60 mL/min   Anion gap 11 5 - 15  CBC with Differential/Platelet     Status: None   Collection Time: 10/06/22  7:09 PM  Result Value Ref Range   WBC 8.5 4.0 - 10.5 K/uL   RBC 4.30 3.87 - 5.11 MIL/uL   Hemoglobin 12.0 12.0 - 15.0 g/dL   HCT 36.1 36.0 - 46.0 %   MCV 84.0 80.0 - 100.0 fL   MCH 27.9 26.0 - 34.0 pg   MCHC 33.2 30.0 - 36.0 g/dL   RDW 13.7 11.5 - 15.5 %   Platelets 357 150 - 400 K/uL   nRBC 0.0 0.0 - 0.2 %   Neutrophils Relative % 58 %   Neutro Abs 5.0 1.7 - 7.7 K/uL   Lymphocytes Relative 32 %   Lymphs Abs 2.7 0.7 - 4.0 K/uL   Monocytes Relative 9 %   Monocytes Absolute 0.7 0.1 - 1.0 K/uL   Eosinophils Relative 1 %   Eosinophils Absolute 0.1 0.0 - 0.5 K/uL   Basophils Relative 0 %   Basophils Absolute 0.0 0.0 - 0.1 K/uL   Immature Granulocytes 0 %   Abs Immature Granulocytes 0.03 0.00 - 0.07 K/uL  HIV Antibody (routine testing w rflx)     Status: None   Collection Time: 10/06/22  7:09 PM  Result Value Ref Range   HIV Screen 4th Generation wRfx Non Reactive Non Reactive    MAU Course  Procedures  MDM Pre-eclampsia labs collected, GBS/GC/CT/Wet prep/FFN IV fluid bolus started  BMZ # 1 ordered  7:30pm: Spoke with Dr. Delora Fuel and informed her of patient's current vitals, exam and labs pending. The patient is  likely going to need transfer due to NICU being full and I wanted her to be aware of the patient early in the course of her care. She would like [place] to confirm the patient's choice of hospital for transfer if transfer becomes necessary.  Patient does not have a preference for hospital to be sent to if transfer is necessary  9:06pm: recheck patient's cervix. She reports no change in contractions. No change in cervical exam (1/50/ballotable) 10:36pm: Spoke with Burman Foster, CNM she is about to call Dr. Delora Fuel and will give her an update on this patient. One of them will call us back.   On reviewing patient's chart this is almost the exact presentation she had in 2018. She came in for pain/contractions and had elevated blood pressure. She was on OBSC  before BP worsening and then 6 days later patient moved to labor and delivery for IOL. She did not have a spontaneous preterm birth. However, she did have pre-eclampsia with severe features and IOL. At 33.3 weeks. Given this history I do feel better that she is not in preterm labor. Howver, it is possible that she could be developing pre-eclampsia. BP is mild range and labs are normal at this time. So outpatient management and close follow up is appropriate.   Assessment and Plan   1. Threatened premature labor in third trimester   2. [redacted] weeks gestation of pregnancy   3. Hypertension  in pregnancy, transient, antepartum, third trimester    DC home Pre-eclampsia warning signs  3rd Trimester precautions  Bleeding precautions PTL precautions  Fetal kick counts RX: none  Return to MAU as needed FU in 24 hours for BMZ #2 and BP check here    Follow-up Information     [facility] Follow up.   Specialty: Obstetrics and Gynecology Why: Return here tomorrow 10/07/2022 around 6:00pm for blood pressure check and steriod shot. Return sooner is contractions resume, continue or worsen or if you have bleeding, leaking fluid or decreased  fetal movement Contact information: [address] [id number] [place] DNP, CNM  10/06/22  11:05 PM

## 2022-10-06 NOTE — Telephone Encounter (Signed)
  Chief Complaint: contractions  Symptoms: abdominal tightness and pressure, comes and goes, last 1-2 mins  Frequency: today 1-2 hrs ago  Pertinent Negatives: Patient denies discharge, or pain  Disposition: [x] ED /[] Urgent Care (no appt availability in office) / [] Appointment(In office/virtual)/ []  [facility]/ [] Home Care/ [] Refused Recommended Disposition /[] [facility]/ []  Follow-up with PCP Additional Notes: pt is currently [redacted]w[redacted]d at work. Started having these sx and unsure how frequent they are but lasts 1-2 mins. Advised to call OB and see what they recommend but if no response to go to L&D.   Called pt back, she states she just called OB and LVM. Recommended pt going to MAU for evaluation. Pt verbalized understanding.   Reason for Disposition  [1] History of prior delivery (multipara) AND [2] contractions < 10 minutes apart AND [3] present 1 hour  Answer Assessment - Initial Assessment Questions 1. ONSET: "When did the symptoms begin?"        Today 1-2 hours ago  2. CONTRACTIONS: "Describe the contractions that you are having." (e.g., duration, frequency, regularity, severity)     Tightness and pressure comes and goes lasting 1-2 mins  3. PREGNANCY: "How many weeks pregnant are you?"     [redacted]w[redacted]d 4. EDD: "What date are you expecting to deliver?"     11/19/22 5. PARITY: "Have you had a baby before?" If Yes, ask: "How long did the labor last?"     2nd baby  6. FETAL MOVEMENT: "Has the baby's movement decreased or changed significantly from normal?"     Normal 7. OTHER SYMPTOMS: "Do you have any other symptoms?" (e.g., leaking fluid from vagina, vaginal bleeding, fever, hand/facial swelling)     No other sx  Protocols used: Pregnancy - Labor-A-AH

## 2022-10-06 NOTE — Progress Notes (Signed)
GBS culture, Wet prep, GC/Chlamydia, and fFN specimens collected by H. Norman Herrlich, CNM.

## 2022-10-07 ENCOUNTER — Inpatient Hospital Stay (EMERGENCY_DEPARTMENT_HOSPITAL)
Admission: AD | Admit: 2022-10-07 | Discharge: 2022-10-07 | Disposition: A | Discharge status: Home / Self Care | Payer: Medicaid Other | Source: Home / Self Care | Attending: Obstetrics and Gynecology | Admitting: Obstetrics and Gynecology

## 2022-10-07 ENCOUNTER — Encounter (HOSPITAL_COMMUNITY): Payer: Self-pay | Admitting: Obstetrics and Gynecology

## 2022-10-07 DIAGNOSIS — O479 False labor, unspecified: Secondary | ICD-10-CM | POA: Diagnosis not present

## 2022-10-07 DIAGNOSIS — O4703 False labor before 37 completed weeks of gestation, third trimester: Secondary | ICD-10-CM | POA: Insufficient documentation

## 2022-10-07 DIAGNOSIS — O3433 Maternal care for cervical incompetence, third trimester: Secondary | ICD-10-CM | POA: Diagnosis not present

## 2022-10-07 DIAGNOSIS — Z3A33 33 weeks gestation of pregnancy: Secondary | ICD-10-CM

## 2022-10-07 DIAGNOSIS — O26893 Other specified pregnancy related conditions, third trimester: Secondary | ICD-10-CM | POA: Insufficient documentation

## 2022-10-07 HISTORY — DX: Calculus of kidney: N20.0

## 2022-10-07 MED ORDER — BETAMETHASONE SOD PHOS & ACET 6 (3-3) MG/ML IJ SUSP
12.0000 mg | Freq: Once | INTRAMUSCULAR | Status: AC
  Administered 2022-10-07: 12 mg via INTRAMUSCULAR

## 2022-10-07 NOTE — MAU Note (Signed)
Jamie Lawrence is a 22 y.o. at [redacted]w[redacted]d here in MAU reporting: was told to come back at 6 for a BP check and the second steroid shot.  Has been contracting since 0900, are like 5 min apart. No bleeding or leaking. Reports +FM.  +HA (did not take anything for it "Tylenol doesn't work for her"), no visual changes, epigastric pain or increase in swelling.   Onset of complaint: 0900 Pain score: 8 Vitals:   10/07/22 1747  BP: 138/88  Pulse: 100  Resp: 18  Temp: 98.8 F (37.1 C)  SpO2: 100%     [id number] dress on , reports +FM Lab orders placed from triage:

## 2022-10-07 NOTE — MAU Provider Note (Signed)
History     [id number]  Arrival date and time: 10/07/22 1721    Chief Complaint  Patient presents with   Contractions     HPI Jamie Lawrence is a 22 y.o. at [redacted]w[redacted]d by LMP who presents for second BMZ injection & contractions. History of preterm induction due to severe preeclampsia. Was seen in MAU yesterday for contractions & had new onset hypertension. Preeclampsia labs were negative but cervix was 1 cm & she was given BMZ. Her FFN was negative.   She is here for her second BMZ injection. Reports to the nurse that she has been contracting every 5 minutes since this morning. Patient states she now feels 3-4 painful contractions per hour & hasn't felt one since she was in the lobby. Denies vaginal bleeding or LOF. Reports good fetal movement.  Denies headache, visual disturbance, or epigastric pain.   --/--/O POS (11/17 1603)  OB History     Gravida  3   Para  1   Term  0   Preterm  1   AB  1   Living  1      SAB  1   IAB  0   Ectopic  0   Multiple  0   Live Births  1           Past Medical History:  Diagnosis Date   Anxiety    Bipolar depression ([place])    Chlamydia infection affecting pregnancy 07/06/2017   tx on 07/04/17   Depression    Eating disorder    GERD (gastroesophageal reflux disease)    Hypertension in pregnancy, preeclampsia, severe, delivered 07/05/2017   Hypomania (HCC)    IBS (irritable bowel syndrome)    Insomnia    Kidney stones    Migraines    Miscarriage 09/2014   Mononucleosis    Mood disturbance    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   Pregnancy induced hypertension    UTI (urinary tract infection)    Vomiting     Past Surgical History:  Procedure Laterality Date   COLONOSCOPY     ESOPHAGOGASTRODUODENOSCOPY ENDOSCOPY      Family History  Problem Relation Age of Onset   Asthma Mother    Hypertension Mother    Asthma Father    Hypertension Father    Mental illness Father    Brain cancer Maternal  Aunt    Colon cancer Maternal Aunt    Hypertension Maternal Grandmother    Hypertension Paternal Grandmother    Hypertension Paternal Grandfather    Diabetes Paternal Grandfather    Pancreatic cancer Neg Hx    Esophageal cancer Neg Hx     Social History   Socioeconomic History   Marital status: Single    Spouse name: Not on file   Number of children: Not on file   Years of education: Not on file   Highest education level: Not on file  Occupational History   Occupation: Ship broker    Comment: Engineer, water  Tobacco Use   Smoking status: Never    Passive exposure: Yes   Smokeless tobacco: Never  Vaping Use   Vaping Use: Never used  Substance and Sexual Activity   Alcohol use: No    Alcohol/week: 0.0 standard drinks of alcohol   Drug use: No    Comment: second hand fob   Sexual activity: Not Currently    Partners: Male    Birth control/protection: None    Comment: last was 3/6  Other Topics  Concern   Not on file  Social History Narrative   Not on file   Social Determinants of Health   Financial Resource Strain: Not on file  Food Insecurity: No Food Insecurity (06/09/2022)   Hunger Vital Sign    Worried About Running Out of Food in the Last Year: Never true    Ran Out of Food in the Last Year: Never true  Transportation Needs: No Transportation Needs (06/09/2022)   PRAPARE - Hydrologist (Medical): No    Lack of Transportation (Non-Medical): No  Physical Activity: Not on file  Stress: Not on file  Social Connections: Not on file  Intimate Partner Violence: Not At Risk (06/09/2022)   Humiliation, Afraid, Rape, and Kick questionnaire    Fear of Current or Ex-Partner: No    Emotionally Abused: No    Physically Abused: No    Sexually Abused: No    No Known Allergies  No current facility-administered medications on file prior to encounter.   Current Outpatient Medications on File Prior to Encounter  Medication Sig Dispense Refill    escitalopram (LEXAPRO) 10 MG tablet Take by mouth.     hydrOXYzine (ATARAX) 25 MG tablet Take 25 mg by mouth every 6 (six) hours.     ondansetron (ZOFRAN) 4 MG tablet Take 4 mg by mouth 3 (three) times daily as needed.     pantoprazole (PROTONIX) 40 MG tablet Take 40 mg by mouth daily.     lidocaine 4 % Place 1 patch onto the skin daily. Leave on for 12 hours or less 10 patch 0   [DISCONTINUED] famotidine (PEPCID) 20 MG tablet Take 1 tablet (20 mg total) by mouth daily. 30 tablet 0   [DISCONTINUED] fluticasone (FLONASE) 50 MCG/ACT nasal spray Place 1 spray into both nostrils daily. 16 g 2     ROS Pertinent positives and negative per HPI, all others reviewed and negative  Physical Exam   BP (!) 140/77 (BP Location: Right Arm)   Pulse 93   Temp 98.3 F (36.8 C) (Oral)   Resp 20   Ht 4\' 11"  (1.499 m)   Wt 68.9 kg   LMP 02/14/2022   SpO2 100%   BMI 30.66 kg/m   Patient Vitals for the past 24 hrs:  BP Temp Temp src Pulse Resp SpO2 Height Weight  10/07/22 1838 (!) 140/77 98.3 F (36.8 C) Oral 93 20 100 % -- --  10/07/22 1759 129/82 -- -- (!) 102 -- -- -- --  10/07/22 1747 138/88 98.8 F (37.1 C) Oral 100 18 100 % 4\' 11"  (1.499 m) 68.9 kg    Physical Exam Vitals and nursing note reviewed. Exam conducted with a chaperone present.  Constitutional:      General: She is not in acute distress.    Appearance: Normal appearance. She is not ill-appearing.  HENT:     Head: Normocephalic and atraumatic.  Eyes:     General: No scleral icterus.    Pupils: Pupils are equal, round, and reactive to light.  Pulmonary:     Effort: Pulmonary effort is normal. No respiratory distress.  Abdominal:     Tenderness: There is no abdominal tenderness.     Comments: gravid  Skin:    General: Skin is warm and dry.  Neurological:     Mental Status: She is alert.      Cervical Exam Dilation: Fingertip Effacement (%): 50 Cervical Position: Posterior Station: [place], -3 Exam by:: Jorje Guild NP  FHT Baseline 140, moderate variability, 15x15 accels, no decels Toco: none Cat: 1  Labs Results for orders placed or performed during the hospital encounter of 10/06/22 (from the past 24 hour(s))  Comprehensive metabolic panel     Status: Abnormal   Collection Time: 10/06/22  7:09 PM  Result Value Ref Range   Sodium 135 135 - 145 mmol/L   Potassium 3.8 3.5 - 5.1 mmol/L   Chloride 103 98 - 111 mmol/L   CO2 21 (L) 22 - 32 mmol/L   Glucose, Bld 83 70 - 99 mg/dL   BUN 5 (L) 6 - 20 mg/dL   Creatinine, Ser 0.54 0.44 - 1.00 mg/dL   Calcium 8.8 (L) 8.9 - 10.3 mg/dL   Total Protein 6.4 (L) 6.5 - 8.1 g/dL   Albumin 3.0 (L) 3.5 - 5.0 g/dL   AST 18 15 - 41 U/L   ALT 12 0 - 44 U/L   Alkaline Phosphatase 178 (H) 38 - 126 U/L   Total Bilirubin 0.3 0.3 - 1.2 mg/dL   GFR, Estimated >60 >60 mL/min   Anion gap 11 5 - 15  CBC with Differential/Platelet     Status: None   Collection Time: 10/06/22  7:09 PM  Result Value Ref Range   WBC 8.5 4.0 - 10.5 K/uL   RBC 4.30 3.87 - 5.11 MIL/uL   Hemoglobin 12.0 12.0 - 15.0 g/dL   HCT 36.1 36.0 - 46.0 %   MCV 84.0 80.0 - 100.0 fL   MCH 27.9 26.0 - 34.0 pg   MCHC 33.2 30.0 - 36.0 g/dL   RDW 13.7 11.5 - 15.5 %   Platelets 357 150 - 400 K/uL   nRBC 0.0 0.0 - 0.2 %   Neutrophils Relative % 58 %   Neutro Abs 5.0 1.7 - 7.7 K/uL   Lymphocytes Relative 32 %   Lymphs Abs 2.7 0.7 - 4.0 K/uL   Monocytes Relative 9 %   Monocytes Absolute 0.7 0.1 - 1.0 K/uL   Eosinophils Relative 1 %   Eosinophils Absolute 0.1 0.0 - 0.5 K/uL   Basophils Relative 0 %   Basophils Absolute 0.0 0.0 - 0.1 K/uL   Immature Granulocytes 0 %   Abs Immature Granulocytes 0.03 0.00 - 0.07 K/uL  HIV Antibody (routine testing w rflx)     Status: None   Collection Time: 10/06/22  7:09 PM  Result Value Ref Range   HIV Screen 4th Generation wRfx Non Reactive Non Reactive    Imaging No results found.  MAU Course  Procedures Lab Orders  No laboratory test(s) ordered  today   Meds ordered this encounter  Medications   betamethasone acetate-betamethasone sodium phosphate (CELESTONE) injection 12 mg   Imaging Orders  No imaging studies ordered today    MDM moderate  Assessment and Plan   1. Braxton Hicks contractions   2. Premature cervical dilation in third trimester   3. [redacted] weeks gestation of pregnancy    -No contractions on monitor & cervix unchanged from yesterday. Reviewed s/s of preterm labor & reasons to return. Received 2nd dose of BMZ while here.  -Elevated BP x 1 (not severe range). Asymptomatic. Contacted [facility]) for patient to have f/u appointment this week.   #FWB: cat 1 tracing    Dispo: discharged to home in stable condition.   Discharge Instructions     Discharge patient   Complete by: As directed    Discharge disposition: 01-Home or Self Care   Discharge patient  date: 10/07/2022       Jorje Guild, NP 10/07/22 6:44 PM  Allergies as of 10/07/2022   No Known Allergies      Medication List     STOP taking these medications    lidocaine 4 %       TAKE these medications    escitalopram 10 MG tablet Commonly known as: LEXAPRO Take by mouth.   hydrOXYzine 25 MG tablet Commonly known as: ATARAX Take 25 mg by mouth every 6 (six) hours.   ondansetron 4 MG tablet Commonly known as: ZOFRAN Take 4 mg by mouth 3 (three) times daily as needed.   pantoprazole 40 MG tablet Commonly known as: PROTONIX Take 40 mg by mouth daily.

## 2022-10-08 LAB — CULTURE, BETA STREP (GROUP B ONLY)

## 2022-10-08 LAB — CULTURE, OB URINE: Culture: 60000 — AB

## 2022-10-09 DIAGNOSIS — F32A Depression, unspecified: Secondary | ICD-10-CM | POA: Diagnosis not present

## 2022-10-09 DIAGNOSIS — E559 Vitamin D deficiency, unspecified: Secondary | ICD-10-CM | POA: Diagnosis not present

## 2022-10-09 DIAGNOSIS — Z8759 Personal history of other complications of pregnancy, childbirth and the puerperium: Secondary | ICD-10-CM | POA: Diagnosis not present

## 2022-10-09 DIAGNOSIS — F319 Bipolar disorder, unspecified: Secondary | ICD-10-CM | POA: Diagnosis not present

## 2022-10-09 DIAGNOSIS — Z3A33 33 weeks gestation of pregnancy: Secondary | ICD-10-CM | POA: Diagnosis not present

## 2022-10-09 DIAGNOSIS — F419 Anxiety disorder, unspecified: Secondary | ICD-10-CM | POA: Diagnosis not present

## 2022-10-09 LAB — GC/CHLAMYDIA PROBE AMP (~~LOC~~) NOT AT ARMC
Chlamydia: POSITIVE — AB
Comment: NEGATIVE
Comment: NORMAL
Neisseria Gonorrhea: NEGATIVE

## 2022-10-11 ENCOUNTER — Inpatient Hospital Stay (HOSPITAL_COMMUNITY)
Admission: AD | Admit: 2022-10-11 | Discharge: 2022-10-11 | Disposition: A | Discharge status: Home / Self Care | Payer: Medicaid Other | Attending: Obstetrics and Gynecology | Admitting: Obstetrics and Gynecology

## 2022-10-11 ENCOUNTER — Encounter (HOSPITAL_COMMUNITY): Payer: Self-pay | Admitting: Obstetrics and Gynecology

## 2022-10-11 DIAGNOSIS — O2690 Pregnancy related conditions, unspecified, unspecified trimester: Secondary | ICD-10-CM | POA: Diagnosis not present

## 2022-10-11 DIAGNOSIS — R52 Pain, unspecified: Secondary | ICD-10-CM | POA: Diagnosis not present

## 2022-10-11 DIAGNOSIS — O09293 Supervision of pregnancy with other poor reproductive or obstetric history, third trimester: Secondary | ICD-10-CM | POA: Insufficient documentation

## 2022-10-11 DIAGNOSIS — O479 False labor, unspecified: Secondary | ICD-10-CM

## 2022-10-11 DIAGNOSIS — Z3A34 34 weeks gestation of pregnancy: Secondary | ICD-10-CM | POA: Insufficient documentation

## 2022-10-11 DIAGNOSIS — O212 Late vomiting of pregnancy: Secondary | ICD-10-CM | POA: Diagnosis not present

## 2022-10-11 DIAGNOSIS — Z3689 Encounter for other specified antenatal screening: Secondary | ICD-10-CM

## 2022-10-11 DIAGNOSIS — O26893 Other specified pregnancy related conditions, third trimester: Secondary | ICD-10-CM | POA: Insufficient documentation

## 2022-10-11 DIAGNOSIS — I10 Essential (primary) hypertension: Secondary | ICD-10-CM | POA: Diagnosis not present

## 2022-10-11 DIAGNOSIS — O26899 Other specified pregnancy related conditions, unspecified trimester: Secondary | ICD-10-CM | POA: Diagnosis not present

## 2022-10-11 DIAGNOSIS — Z743 Need for continuous supervision: Secondary | ICD-10-CM | POA: Diagnosis not present

## 2022-10-11 LAB — CBC
HCT: 32.7 % — ABNORMAL LOW (ref 36.0–46.0)
Hemoglobin: 11.4 g/dL — ABNORMAL LOW (ref 12.0–15.0)
MCH: 28.4 pg (ref 26.0–34.0)
MCHC: 34.9 g/dL (ref 30.0–36.0)
MCV: 81.3 fL (ref 80.0–100.0)
Platelets: 383 10*3/uL (ref 150–400)
RBC: 4.02 MIL/uL (ref 3.87–5.11)
RDW: 13.4 % (ref 11.5–15.5)
WBC: 10.2 10*3/uL (ref 4.0–10.5)
nRBC: 0 % (ref 0.0–0.2)

## 2022-10-11 LAB — COMPREHENSIVE METABOLIC PANEL
ALT: 12 U/L (ref 0–44)
AST: 18 U/L (ref 15–41)
Albumin: 2.8 g/dL — ABNORMAL LOW (ref 3.5–5.0)
Alkaline Phosphatase: 171 U/L — ABNORMAL HIGH (ref 38–126)
Anion gap: 11 (ref 5–15)
BUN: <5 mg/dL — ABNORMAL LOW (ref 6–20)
CO2: 23 mmol/L (ref 22–32)
Calcium: 8.8 mg/dL — ABNORMAL LOW (ref 8.9–10.3)
Chloride: 101 mmol/L (ref 98–111)
Creatinine, Ser: 0.5 mg/dL (ref 0.44–1.00)
GFR, Estimated: >60 mL/min (ref >60)
Glucose, Bld: 83 mg/dL (ref 70–99)
Potassium: 3.8 mmol/L (ref 3.5–5.1)
Sodium: 135 mmol/L (ref 135–145)
Total Bilirubin: 0.3 mg/dL (ref 0.3–1.2)
Total Protein: 6.3 g/dL — ABNORMAL LOW (ref 6.5–8.1)

## 2022-10-11 LAB — PROTEIN / CREATININE RATIO, URINE
Creatinine, Urine: 50 mg/dL
Protein Creatinine Ratio: 0.28 mg/mg{Cre} — ABNORMAL HIGH (ref 0.00–0.15)
Total Protein, Urine: 14 mg/dL

## 2022-10-11 LAB — TYPE AND SCREEN
ABO/RH(D): O POS
Antibody Screen: NEGATIVE

## 2022-10-11 LAB — AMNISURE RUPTURE OF MEMBRANE (ROM) NOT AT ARMC: Amnisure ROM: NEGATIVE

## 2022-10-11 LAB — POCT FERN TEST: POCT Fern Test: NEGATIVE

## 2022-10-11 MED ORDER — LACTATED RINGERS IV BOLUS
1000.0000 mL | Freq: Once | INTRAVENOUS | Status: AC
  Administered 2022-10-11: 1000 mL via INTRAVENOUS

## 2022-10-11 MED ORDER — CYCLOBENZAPRINE HCL 5 MG PO TABS
10.0000 mg | ORAL_TABLET | Freq: Once | ORAL | Status: AC
  Administered 2022-10-11: 10 mg via ORAL
  Filled 2022-10-11: qty 2

## 2022-10-11 MED ORDER — NIFEDIPINE 10 MG PO CAPS
10.0000 mg | ORAL_CAPSULE | ORAL | Status: AC | PRN
  Administered 2022-10-11 (×3): 10 mg via ORAL
  Filled 2022-10-11 (×3): qty 1

## 2022-10-11 NOTE — MAU Note (Addendum)
Jamie Lawrence is a 22 y.o. at [redacted]w[redacted]d here in MAU reporting: by EMS complaining of CTX and LOF. Pt states the LOF started after she threw up today around 1800. Pt states the fluid was clear with mucous in it. Pt states the ctx are very close together. Pt feels ctx in her lower abdomen and lower back. +FM. Pt denies VB. [name], CNM at bedside upon arrival.   Onset of complaint: 10/11/2022 Pain score: 9/10 Vitals:   10/11/22 1856 10/11/22 1857  BP: (!) 140/90 (!) 140/90  Pulse: 99 97  Resp: (!) 22   Temp: 98.8 F (37.1 C)   SpO2: 94% 95%     FHT:152 Lab orders placed from triage:  fern

## 2022-10-11 NOTE — MAU Provider Note (Signed)
History   CSN: [id number]  Arrival date and time: 10/11/22 1843  Event Date/Time  First Provider Initiated Contact with Patient 10/11/22 1848     Chief Complaint  Patient presents with   Contractions   Rupture of Membranes   HPI Jamie Lawrence is a 22 y.o. [id number] at [redacted]w[redacted]d who presents to MAU via EMS with chief complaint of leaking of fluid. Patient states she vomited then immediately experienced a gush of fluid. This occurred at 1800 hours today. She endorses active LOF on arrival to MAU.  Patient also reports recurrent painful contractions q 2-5 min. Pain score is 10/10. She states her pain is more intense and her contractions are closer together than on her previous MAU visits on 10/06/2022 and 10/07/2022. She denies vaginal bleeding, dysuria, fever or recent illness.  Patient's OB history if significant for preterm induction for Severe Preeclampsia. She states she has been normotensive in her current pregnancy. She denies headache, visual disturbances, RUQ/epigastric pain, new onset swelling or weight gain.  Patient receives care with [facility].  OB History     Gravida  3   Para  1   Term  0   Preterm  1   AB  1   Living  1      SAB  1   IAB  0   Ectopic  0   Multiple  0   Live Births  1           Past Medical History:  Diagnosis Date   Anxiety    Bipolar depression ([place])    Chlamydia infection affecting pregnancy 07/06/2017   tx on 07/04/17   Depression    Eating disorder    GERD (gastroesophageal reflux disease)    Hypertension in pregnancy, preeclampsia, severe, delivered 07/05/2017   Hypomania ([facility])    IBS (irritable bowel syndrome)    Insomnia    Kidney stones    Migraines    Miscarriage 09/2014   Mononucleosis    Mood disturbance    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   Pregnancy induced hypertension    UTI (urinary tract infection)    Vomiting     Past Surgical History:  Procedure Laterality Date   COLONOSCOPY      ESOPHAGOGASTRODUODENOSCOPY ENDOSCOPY      Family History  Problem Relation Age of Onset   Asthma Mother    Hypertension Mother    Asthma Father    Hypertension Father    Mental illness Father    Brain cancer Maternal Aunt    Colon cancer Maternal Aunt    Hypertension Maternal Grandmother    Hypertension Paternal Grandmother    Hypertension Paternal Grandfather    Diabetes Paternal Grandfather    Pancreatic cancer Neg Hx    Esophageal cancer Neg Hx     Social History   Tobacco Use   Smoking status: Never    Passive exposure: Yes   Smokeless tobacco: Never  Vaping Use   Vaping Use: Never used  Substance Use Topics   Alcohol use: No    Alcohol/week: 0.0 standard drinks of alcohol   Drug use: No    Comment: second hand fob    Allergies: No Known Allergies  Medications Prior to Admission  Medication Sig Dispense Refill Last Dose   escitalopram (LEXAPRO) 10 MG tablet Take by mouth.      hydrOXYzine (ATARAX) 25 MG tablet Take 25 mg by mouth every 6 (six) hours.      ondansetron (ZOFRAN) 4 MG  tablet Take 4 mg by mouth 3 (three) times daily as needed.      pantoprazole (PROTONIX) 40 MG tablet Take 40 mg by mouth daily.       Review of Systems  Gastrointestinal:  Positive for abdominal pain.  Genitourinary:  Positive for vaginal discharge.  All other systems reviewed and are negative.  Physical Exam   Blood pressure (!) 140/90, pulse 97, temperature 98.8 F (37.1 C), temperature source Oral, resp. rate (!) 22, height 4\' 11"  (1.499 m), weight 69.4 kg, last menstrual period 02/14/2022, SpO2 95 %.  Physical Exam Vitals and nursing note reviewed. Exam conducted with a chaperone present.  Constitutional:      General: She is in acute distress.     Appearance: Normal appearance. She is not ill-appearing.  Cardiovascular:     Rate and Rhythm: Normal rate and regular rhythm.     Pulses: Normal pulses.     Heart sounds: Normal heart sounds.  Pulmonary:     Effort:  Pulmonary effort is normal.     Breath sounds: Normal breath sounds.  Abdominal:     Comments: Gravid  Skin:    Capillary Refill: Capillary refill takes less than 2 seconds.  Neurological:     Mental Status: She is alert and oriented to person, place, and time.  Psychiatric:        Mood and Affect: Mood normal.        Behavior: Behavior normal.        Thought Content: Thought content normal.        Judgment: Judgment normal.     MAU Course  Procedures  MDM  --OB hx significant for 2018 preterm delivery. This occurred following antepartum admission for preterm contractions with subsequent IOL for severe preeclampsia. Patient meets criteria for GHTN in current pregnancy, currently asymptomatic. Will collect PEC labs due to acute onset of symptoms at 32 weeks with 2018 pregnancy.  --Negative FFN in MAU 10/06/2022  --S/p BMZ 03/15 and 03/16  --Cervix 1/thick/posterior, unchanged from MAU exam 10/06/2022  --Negative LOF on arrival, negative fern, will collect Amnisure  --1945: CNM returned to bedside to confirm Amnisure negative, membranes intact. Patient agreeable to Procardia series. Shared with her toco reflects UI, may not experience significant relief with Procardia. Will also give Flexeril. Patient declines Tylenol as it never works for her  --2055: Updated received from [facility]. Patient continues to report recurrent painful contractions with slightly improved pain score of 7/10. Declines repeat cervical exam. Will notify Dr. Landry Mellow of patient report vs toco tracing once [facility] labs result  --2110: CNM returned to bedside. Reviewed GHTN diagnosis, PEC labs WNL (P:Cr 0.28). Patient agreeable to repeat cervical exam. Verbalizes to CNM she feels safe going home. She is not requesting admission for contractions at this time.   --2115: Dr. Landry Mellow covering for [place]. Notified of patient workup, 3rd MAU visit in 5 days for preterm contractions. Reviewed response to interventions, absence of  labor, negative FFN and completion of BMZ series. Dr. Landry Mellow in agreement with plan for discharge, will forward information to [facility].  Patient Vitals for the past 24 hrs:  BP Temp Temp src Pulse Resp SpO2 Height Weight  10/11/22 2101 120/68 -- -- (!) 117 -- -- -- --  10/11/22 2046 121/77 -- -- (!) 114 -- -- -- --  10/11/22 2041 123/79 -- -- (!) 107 -- 98 % -- --  10/11/22 2040 123/79 -- -- -- -- -- -- --  10/11/22 2030 125/75 -- --  99 -- 97 % -- --  10/11/22 2021 117/66 -- -- (!) 115 -- 100 % -- --  10/11/22 2020 117/66 -- -- -- -- -- -- --  10/11/22 2016 122/62 -- -- (!) 115 -- -- -- --  10/11/22 2000 133/86 -- -- 75 -- 100 % -- --  10/11/22 1958 (!) 135/90 -- -- 91 -- 100 % -- --  10/11/22 1957 (!) 135/90 -- -- -- -- -- -- --  10/11/22 1949 (!) 134/93 -- -- 83 -- -- -- --  10/11/22 1931 135/85 -- -- 98 -- -- -- --  10/11/22 1915 133/84 -- -- 99 -- 95 % -- --  10/11/22 1857 (!) 140/90 -- -- 97 -- 95 % -- --  10/11/22 1856 (!) 140/90 98.8 F (37.1 C) Oral 99 (!) 22 94 % 4\' 11"  (1.499 m) 69.4 kg   Results for orders placed or performed during the hospital encounter of 10/11/22 (from the past 24 hour(s))  Fern Test     Status: None   Collection Time: 10/11/22  6:57 PM  Result Value Ref Range   POCT Fern Test Negative = intact amniotic membranes   CBC     Status: Abnormal   Collection Time: 10/11/22  7:02 PM  Result Value Ref Range   WBC 10.2 4.0 - 10.5 K/uL   RBC 4.02 3.87 - 5.11 MIL/uL   Hemoglobin 11.4 (L) 12.0 - 15.0 g/dL   HCT 32.7 (L) 36.0 - 46.0 %   MCV 81.3 80.0 - 100.0 fL   MCH 28.4 26.0 - 34.0 pg   MCHC 34.9 30.0 - 36.0 g/dL   RDW 13.4 11.5 - 15.5 %   Platelets 383 150 - 400 K/uL   nRBC 0.0 0.0 - 0.2 %  Comprehensive metabolic panel     Status: Abnormal   Collection Time: 10/11/22  7:02 PM  Result Value Ref Range   Sodium 135 135 - 145 mmol/L   Potassium 3.8 3.5 - 5.1 mmol/L   Chloride 101 98 - 111 mmol/L   CO2 23 22 - 32 mmol/L   Glucose, Bld 83 70 - 99 mg/dL    BUN <5 (L) 6 - 20 mg/dL   Creatinine, Ser 0.50 0.44 - 1.00 mg/dL   Calcium 8.8 (L) 8.9 - 10.3 mg/dL   Total Protein 6.3 (L) 6.5 - 8.1 g/dL   Albumin 2.8 (L) 3.5 - 5.0 g/dL   AST 18 15 - 41 U/L   ALT 12 0 - 44 U/L   Alkaline Phosphatase 171 (H) 38 - 126 U/L   Total Bilirubin 0.3 0.3 - 1.2 mg/dL   GFR, Estimated >60 >60 mL/min   Anion gap 11 5 - 15  Amnisure rupture of membrane (rom)not at [facility]     Status: None   Collection Time: 10/11/22  7:03 PM  Result Value Ref Range   Amnisure ROM NEGATIVE   Type and screen [place]     Status: None   Collection Time: 10/11/22  7:16 PM  Result Value Ref Range   ABO/RH(D) O POS    Antibody Screen NEG    Sample Expiration      10/14/2022,2359 Performed at [facility], [address], [place], [place] [address]   Protein / creatinine ratio, urine     Status: Abnormal   Collection Time: 10/11/22  7:38 PM  Result Value Ref Range   Creatinine, Urine 50 mg/dL   Total Protein, Urine 14 mg/dL  Protein Creatinine Ratio 0.28 (H) 0.00 - 0.15 mg/mg[Cre]   Meds ordered this encounter  Medications   lactated ringers bolus 1,000 mL   cyclobenzaprine (FLEXERIL) tablet 10 mg   NIFEdipine (PROCARDIA) capsule 10 mg   Assessment and Plan  --22 y.o. [id number] at [redacted]w[redacted]d  --Reactive tracing --Cervix remains 1/thick/very posterior prior to discharge, unchanged from 03/15 --S/p BMZ 03/15 and 03/16 --Negative FFN 03/15 --Care coordinated with Dr. Landry Mellow, on call for [place] --Discharge home in stable condition  F/U: --Patient's next Ob appt is 03/26  Darlina Rumpf, [place], MSN, CNM

## 2022-10-17 DIAGNOSIS — Z362 Encounter for other antenatal screening follow-up: Secondary | ICD-10-CM | POA: Diagnosis not present

## 2022-10-17 DIAGNOSIS — O139 Gestational [pregnancy-induced] hypertension without significant proteinuria, unspecified trimester: Secondary | ICD-10-CM | POA: Diagnosis not present

## 2022-10-17 LAB — OB RESULTS CONSOLE GBS: GBS: POSITIVE

## 2022-10-20 ENCOUNTER — Encounter (HOSPITAL_COMMUNITY): Payer: Self-pay | Admitting: *Deleted

## 2022-10-20 ENCOUNTER — Telehealth (HOSPITAL_COMMUNITY): Payer: Self-pay | Admitting: *Deleted

## 2022-10-20 NOTE — Telephone Encounter (Signed)
Preadmission screen positive Chlamydia noted on 3/15.  No treatment notes in MAU charting or pt prenatal record from [facility].  Dr Mancel Bale notified.

## 2022-10-23 DIAGNOSIS — Z419 Encounter for procedure for purposes other than remedying health state, unspecified: Secondary | ICD-10-CM | POA: Diagnosis not present

## 2022-10-27 ENCOUNTER — Other Ambulatory Visit: Payer: Self-pay | Admitting: Obstetrics and Gynecology

## 2022-10-31 ENCOUNTER — Inpatient Hospital Stay (HOSPITAL_COMMUNITY): Payer: Medicaid Other

## 2022-10-31 ENCOUNTER — Inpatient Hospital Stay (HOSPITAL_COMMUNITY): Payer: Medicaid Other | Admitting: Anesthesiology

## 2022-10-31 ENCOUNTER — Other Ambulatory Visit: Payer: Self-pay

## 2022-10-31 ENCOUNTER — Inpatient Hospital Stay (HOSPITAL_COMMUNITY)
Admission: RE | Admit: 2022-10-31 | Discharge: 2022-11-03 | DRG: 807 | Disposition: A | Discharge status: Home / Self Care | Payer: Medicaid Other | Attending: Obstetrics and Gynecology | Admitting: Obstetrics and Gynecology

## 2022-10-31 ENCOUNTER — Encounter (HOSPITAL_COMMUNITY): Payer: Self-pay | Admitting: Obstetrics and Gynecology

## 2022-10-31 DIAGNOSIS — K219 Gastro-esophageal reflux disease without esophagitis: Secondary | ICD-10-CM | POA: Diagnosis not present

## 2022-10-31 DIAGNOSIS — Z87442 Personal history of urinary calculi: Secondary | ICD-10-CM | POA: Diagnosis not present

## 2022-10-31 DIAGNOSIS — Z3A37 37 weeks gestation of pregnancy: Secondary | ICD-10-CM

## 2022-10-31 DIAGNOSIS — O134 Gestational [pregnancy-induced] hypertension without significant proteinuria, complicating childbirth: Secondary | ICD-10-CM | POA: Diagnosis not present

## 2022-10-31 DIAGNOSIS — O139 Gestational [pregnancy-induced] hypertension without significant proteinuria, unspecified trimester: Principal | ICD-10-CM | POA: Diagnosis present

## 2022-10-31 DIAGNOSIS — F411 Generalized anxiety disorder: Secondary | ICD-10-CM | POA: Diagnosis not present

## 2022-10-31 DIAGNOSIS — O99344 Other mental disorders complicating childbirth: Secondary | ICD-10-CM | POA: Diagnosis present

## 2022-10-31 DIAGNOSIS — O9962 Diseases of the digestive system complicating childbirth: Secondary | ICD-10-CM | POA: Diagnosis not present

## 2022-10-31 DIAGNOSIS — O164 Unspecified maternal hypertension, complicating childbirth: Secondary | ICD-10-CM | POA: Diagnosis not present

## 2022-10-31 DIAGNOSIS — O99824 Streptococcus B carrier state complicating childbirth: Secondary | ICD-10-CM | POA: Diagnosis present

## 2022-10-31 DIAGNOSIS — O326XX0 Maternal care for compound presentation, not applicable or unspecified: Secondary | ICD-10-CM | POA: Diagnosis present

## 2022-10-31 DIAGNOSIS — F418 Other specified anxiety disorders: Secondary | ICD-10-CM | POA: Diagnosis not present

## 2022-10-31 DIAGNOSIS — F319 Bipolar disorder, unspecified: Secondary | ICD-10-CM | POA: Diagnosis present

## 2022-10-31 LAB — COMPREHENSIVE METABOLIC PANEL
ALT: 10 U/L (ref 0–44)
AST: 18 U/L (ref 15–41)
Albumin: 2.6 g/dL — ABNORMAL LOW (ref 3.5–5.0)
Alkaline Phosphatase: 222 U/L — ABNORMAL HIGH (ref 38–126)
Anion gap: 8 (ref 5–15)
BUN: 6 mg/dL (ref 6–20)
CO2: 21 mmol/L — ABNORMAL LOW (ref 22–32)
Calcium: 8.6 mg/dL — ABNORMAL LOW (ref 8.9–10.3)
Chloride: 106 mmol/L (ref 98–111)
Creatinine, Ser: 0.53 mg/dL (ref 0.44–1.00)
GFR, Estimated: >60 mL/min (ref >60)
Glucose, Bld: 77 mg/dL (ref 70–99)
Potassium: 3.9 mmol/L (ref 3.5–5.1)
Sodium: 135 mmol/L (ref 135–145)
Total Bilirubin: 0.7 mg/dL (ref 0.3–1.2)
Total Protein: 6 g/dL — ABNORMAL LOW (ref 6.5–8.1)

## 2022-10-31 LAB — CBC
HCT: 32.8 % — ABNORMAL LOW (ref 36.0–46.0)
HCT: 35.5 % — ABNORMAL LOW (ref 36.0–46.0)
Hemoglobin: 11.1 g/dL — ABNORMAL LOW (ref 12.0–15.0)
Hemoglobin: 12.4 g/dL (ref 12.0–15.0)
MCH: 27.4 pg (ref 26.0–34.0)
MCH: 28.2 pg (ref 26.0–34.0)
MCHC: 33.8 g/dL (ref 30.0–36.0)
MCHC: 34.9 g/dL (ref 30.0–36.0)
MCV: 81 fL (ref 80.0–100.0)
MCV: 80.9 fL (ref 80.0–100.0)
Platelets: 323 10*3/uL (ref 150–400)
Platelets: 337 10*3/uL (ref 150–400)
RBC: 4.05 MIL/uL (ref 3.87–5.11)
RBC: 4.39 MIL/uL (ref 3.87–5.11)
RDW: 13.8 % (ref 11.5–15.5)
RDW: 13.8 % (ref 11.5–15.5)
WBC: 5.6 10*3/uL (ref 4.0–10.5)
WBC: 7.8 10*3/uL (ref 4.0–10.5)
nRBC: 0 % (ref 0.0–0.2)
nRBC: 0 % (ref 0.0–0.2)

## 2022-10-31 LAB — PROTEIN / CREATININE RATIO, URINE
Creatinine, Urine: 152 mg/dL
Protein Creatinine Ratio: 0.11 mg/mg{Cre} (ref 0.00–0.15)
Total Protein, Urine: 17 mg/dL

## 2022-10-31 LAB — RPR: RPR Ser Ql: NONREACTIVE

## 2022-10-31 LAB — TYPE AND SCREEN
ABO/RH(D): O POS
Antibody Screen: NEGATIVE

## 2022-10-31 MED ORDER — PHENYLEPHRINE 80 MCG/ML (10ML) SYRINGE FOR IV PUSH (FOR BLOOD PRESSURE SUPPORT): 80.0000 ug | PREFILLED_SYRINGE | INTRAVENOUS | Status: DC | PRN

## 2022-10-31 MED ORDER — DIPHENHYDRAMINE HCL 50 MG/ML IJ SOLN: 12.5000 mg | INTRAMUSCULAR | Status: DC | PRN

## 2022-10-31 MED ORDER — EPHEDRINE 5 MG/ML INJ: 10.0000 mg | INTRAVENOUS | Status: DC | PRN

## 2022-10-31 MED ORDER — SODIUM CHLORIDE 0.9 % IV SOLN
5.0000 10*6.[IU] | Freq: Once | INTRAVENOUS | Status: AC
  Administered 2022-10-31: 5 10*6.[IU] via INTRAVENOUS
  Filled 2022-10-31: qty 5

## 2022-10-31 MED ORDER — LACTATED RINGERS IV SOLN: 500.0000 mL | INTRAVENOUS | Status: DC | PRN

## 2022-10-31 MED ORDER — FENTANYL CITRATE (PF) 100 MCG/2ML IJ SOLN
50.0000 ug | INTRAMUSCULAR | Status: DC | PRN
  Administered 2022-10-31 – 2022-11-01 (×2): 100 ug via INTRAVENOUS
  Filled 2022-10-31: qty 2

## 2022-10-31 MED ORDER — TERBUTALINE SULFATE 1 MG/ML IJ SOLN: 0.2500 mg | Freq: Once | INTRAMUSCULAR | Status: DC | PRN

## 2022-10-31 MED ORDER — LACTATED RINGERS IV SOLN: INTRAVENOUS | Status: DC

## 2022-10-31 MED ORDER — OXYTOCIN BOLUS FROM INFUSION
333.0000 mL | Freq: Once | INTRAVENOUS | Status: AC
  Administered 2022-11-01: 333 mL via INTRAVENOUS

## 2022-10-31 MED ORDER — OXYCODONE-ACETAMINOPHEN 5-325 MG PO TABS: 2.0000 | ORAL_TABLET | ORAL | Status: DC | PRN

## 2022-10-31 MED ORDER — ACETAMINOPHEN 325 MG PO TABS: 650.0000 mg | ORAL_TABLET | ORAL | Status: DC | PRN

## 2022-10-31 MED ORDER — SOD CITRATE-CITRIC ACID 500-334 MG/5ML PO SOLN: 30.0000 mL | ORAL | Status: DC | PRN

## 2022-10-31 MED ORDER — OXYTOCIN-SODIUM CHLORIDE 30-0.9 UT/500ML-% IV SOLN: 2.5000 [IU]/h | INTRAVENOUS | Status: DC

## 2022-10-31 MED ORDER — OXYCODONE-ACETAMINOPHEN 5-325 MG PO TABS: 1.0000 | ORAL_TABLET | ORAL | Status: DC | PRN

## 2022-10-31 MED ORDER — FENTANYL-BUPIVACAINE-NACL 0.5-0.125-0.9 MG/250ML-% EP SOLN
12.0000 mL/h | EPIDURAL | Status: DC | PRN
  Administered 2022-10-31: 12 mL/h via EPIDURAL
  Filled 2022-10-31: qty 250

## 2022-10-31 MED ORDER — OXYTOCIN-SODIUM CHLORIDE 30-0.9 UT/500ML-% IV SOLN
1.0000 m[IU]/min | INTRAVENOUS | Status: DC
  Administered 2022-10-31: 1 m[IU]/min via INTRAVENOUS
  Filled 2022-10-31: qty 500

## 2022-10-31 MED ORDER — LIDOCAINE HCL (PF) 1 % IJ SOLN
30.0000 mL | INTRAMUSCULAR | Status: AC | PRN
  Administered 2022-11-01: 30 mL via SUBCUTANEOUS
  Filled 2022-10-31: qty 30

## 2022-10-31 MED ORDER — LACTATED RINGERS IV SOLN: 500.0000 mL | Freq: Once | INTRAVENOUS | Status: DC

## 2022-10-31 MED ORDER — ONDANSETRON HCL 4 MG/2ML IJ SOLN
4.0000 mg | Freq: Four times a day (QID) | INTRAMUSCULAR | Status: DC | PRN
  Administered 2022-10-31: 4 mg via INTRAVENOUS
  Filled 2022-10-31: qty 2

## 2022-10-31 MED ORDER — MISOPROSTOL 25 MCG QUARTER TABLET
25.0000 ug | ORAL_TABLET | ORAL | Status: DC | PRN
  Administered 2022-10-31: 25 ug via VAGINAL
  Filled 2022-10-31: qty 1

## 2022-10-31 MED ORDER — HYDROXYZINE HCL 25 MG PO TABS: 25.0000 mg | ORAL_TABLET | Freq: Three times a day (TID) | ORAL | Status: DC | PRN

## 2022-10-31 MED ORDER — PANTOPRAZOLE SODIUM 40 MG IV SOLR
40.0000 mg | Freq: Once | INTRAVENOUS | Status: AC
  Administered 2022-10-31: 40 mg via INTRAVENOUS
  Filled 2022-10-31: qty 10

## 2022-10-31 MED ORDER — LIDOCAINE HCL (PF) 1 % IJ SOLN
INTRAMUSCULAR | Status: DC | PRN
  Administered 2022-10-31: 11 mL via EPIDURAL

## 2022-10-31 MED ORDER — PENICILLIN G POT IN DEXTROSE 60000 UNIT/ML IV SOLN
3.0000 10*6.[IU] | INTRAVENOUS | Status: DC
  Administered 2022-10-31 – 2022-11-01 (×5): 3 10*6.[IU] via INTRAVENOUS
  Filled 2022-10-31 (×5): qty 50

## 2022-10-31 NOTE — Progress Notes (Signed)
Jamie Lawrence is a 22 y.o. [phone] at [redacted]w[redacted]d admitted for induction of labor due to gestational hypertension.  Subjective: Patient comfortable with epidural on pitocin at 9 mu/min. Fetal head well applied to cervix. AROM performed with clear fluid noted.  Objective: BP (!) 138/91   Pulse 75   Temp 98 F (36.7 C) (Oral)   Resp 15   Ht 4\' 11"  (1.499 m)   Wt 70.8 kg   LMP 02/14/2022   SpO2 100%   BMI 31.51 kg/m  No intake/output data recorded. No intake/output data recorded.  FHT:  FHR: 135 bpm, variability: moderate,  accelerations:  Present,  decelerations:  Absent UC:   not tracing as patient on her side SVE:   Dilation: 3 Effacement (%): 70 Station: -2 Exam by:: J.Cox, RN  Labs: Lab Results  Component Value Date   WBC 7.8 10/31/2022   HGB 12.4 10/31/2022   HCT 35.5 (L) 10/31/2022   MCV 80.9 10/31/2022   PLT 337 10/31/2022    Assessment / Plan: Induction of labor due to gestational hypertension,  progressing well on pitocin  Labor: Progressing normally, AROM with clear fluid at 2030 Preeclampsia:  no signs or symptoms of toxicity Fetal Wellbeing:  Category I Pain Control:  Epidural I/D:   GBS positive s/p adequate PCN Anticipated MOD:  NSVD  Jackie Plum, MD 10/31/2022, 8:39 PM

## 2022-10-31 NOTE — Anesthesia Preprocedure Evaluation (Signed)
Anesthesia Evaluation  Patient identified by MRN, date of birth, ID band Patient awake    Reviewed: Allergy & Precautions, H&P , NPO status , Patient's Chart, lab work & pertinent test results  Airway Mallampati: I   Neck ROM: full    Dental   Pulmonary neg pulmonary ROS   breath sounds clear to auscultation       Cardiovascular hypertension,  Rhythm:regular Rate:Normal     Neuro/Psych  Headaches PSYCHIATRIC DISORDERS Anxiety Depression Bipolar Disorder      GI/Hepatic ,GERD  ,,  Endo/Other    Renal/GU      Musculoskeletal   Abdominal   Peds  Hematology   Anesthesia Other Findings   Reproductive/Obstetrics (+) Pregnancy                             Anesthesia Physical Anesthesia Plan  ASA: II  Anesthesia Plan: Epidural   Post-op Pain Management:    Induction: Intravenous  PONV Risk Score and Plan: 2 and Treatment may vary due to age or medical condition  Airway Management Planned: Natural Airway  Additional Equipment:   Intra-op Plan:   Post-operative Plan:   Informed Consent: I have reviewed the patients History and Physical, chart, labs and discussed the procedure including the risks, benefits and alternatives for the proposed anesthesia with the patient or authorized representative who has indicated his/her understanding and acceptance.       Plan Discussed with: Anesthesiologist  Anesthesia Plan Comments:         Anesthesia Quick Evaluation
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
## 2022-10-31 NOTE — Anesthesia Procedure Notes (Signed)
Epidural Patient location during procedure: OB Start time: 10/31/2022 2:52 PM End time: 10/31/2022 3:11 PM  Staffing Anesthesiologist: Lowella Curb, MD Performed: anesthesiologist   Preanesthetic Checklist Completed: patient identified, IV checked, site marked, risks and benefits discussed, surgical consent, monitors and equipment checked, pre-op evaluation and timeout performed  Epidural Patient position: sitting Prep: ChloraPrep Patient monitoring: heart rate, cardiac monitor, continuous pulse ox and blood pressure Approach: midline Location: L2-L3 Injection technique: LOR saline  Needle:  Needle type: Tuohy  Needle gauge: 17 G Needle length: 9 cm Needle insertion depth: 9 cm Catheter type: closed end flexible Catheter size: 20 Guage Catheter at skin depth: 13 cm Test dose: negative  Assessment Events: blood not aspirated, injection not painful, no injection resistance, no paresthesia and negative IV test  Additional Notes Reason for block:procedure for pain
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
## 2022-11-01 ENCOUNTER — Encounter (HOSPITAL_COMMUNITY): Payer: Self-pay | Admitting: Obstetrics and Gynecology

## 2022-11-01 DIAGNOSIS — O139 Gestational [pregnancy-induced] hypertension without significant proteinuria, unspecified trimester: Secondary | ICD-10-CM | POA: Diagnosis not present

## 2022-11-01 DIAGNOSIS — Z3A37 37 weeks gestation of pregnancy: Secondary | ICD-10-CM | POA: Diagnosis not present

## 2022-11-01 LAB — CBC
HCT: 33.7 % — ABNORMAL LOW (ref 36.0–46.0)
Hemoglobin: 11.4 g/dL — ABNORMAL LOW (ref 12.0–15.0)
MCH: 27.6 pg (ref 26.0–34.0)
MCHC: 33.8 g/dL (ref 30.0–36.0)
MCV: 81.6 fL (ref 80.0–100.0)
Platelets: 299 10*3/uL (ref 150–400)
RBC: 4.13 MIL/uL (ref 3.87–5.11)
RDW: 13.8 % (ref 11.5–15.5)
WBC: 11 10*3/uL — ABNORMAL HIGH (ref 4.0–10.5)
nRBC: 0 % (ref 0.0–0.2)

## 2022-11-01 MED ORDER — FENTANYL CITRATE (PF) 100 MCG/2ML IJ SOLN
INTRAMUSCULAR | Status: AC
  Filled 2022-11-01: qty 2

## 2022-11-01 MED ORDER — LACTATED RINGERS AMNIOINFUSION: INTRAVENOUS | Status: DC

## 2022-11-01 MED ORDER — COCONUT OIL OIL: 1.0000 | TOPICAL_OIL | Status: DC | PRN

## 2022-11-01 MED ORDER — OXYTOCIN-SODIUM CHLORIDE 30-0.9 UT/500ML-% IV SOLN
1.0000 m[IU]/min | INTRAVENOUS | Status: DC
  Administered 2022-11-01: 2 m[IU]/min via INTRAVENOUS

## 2022-11-01 MED ORDER — BENZOCAINE-MENTHOL 20-0.5 % EX AERO
1.0000 | INHALATION_SPRAY | CUTANEOUS | Status: DC | PRN
  Filled 2022-11-01: qty 56

## 2022-11-01 MED ORDER — ONDANSETRON HCL 4 MG PO TABS: 4.0000 mg | ORAL_TABLET | ORAL | Status: DC | PRN

## 2022-11-01 MED ORDER — NIFEDIPINE ER OSMOTIC RELEASE 30 MG PO TB24
30.0000 mg | ORAL_TABLET | Freq: Every day | ORAL | Status: DC
  Administered 2022-11-01 – 2022-11-03 (×3): 30 mg via ORAL
  Filled 2022-11-01 (×3): qty 1

## 2022-11-01 MED ORDER — ESCITALOPRAM OXALATE 10 MG PO TABS
10.0000 mg | ORAL_TABLET | Freq: Every day | ORAL | Status: DC
  Administered 2022-11-01 – 2022-11-03 (×3): 10 mg via ORAL
  Filled 2022-11-01 (×3): qty 1

## 2022-11-01 MED ORDER — OXYCODONE HCL 5 MG PO TABS: 5.0000 mg | ORAL_TABLET | ORAL | Status: DC | PRN

## 2022-11-01 MED ORDER — PRENATAL MULTIVITAMIN CH
1.0000 | ORAL_TABLET | Freq: Every day | ORAL | Status: DC
  Administered 2022-11-01 – 2022-11-03 (×3): 1 via ORAL
  Filled 2022-11-01 (×3): qty 1

## 2022-11-01 MED ORDER — DIPHENHYDRAMINE HCL 25 MG PO CAPS: 25.0000 mg | ORAL_CAPSULE | Freq: Four times a day (QID) | ORAL | Status: DC | PRN

## 2022-11-01 MED ORDER — DIBUCAINE (PERIANAL) 1 % EX OINT: 1.0000 | TOPICAL_OINTMENT | CUTANEOUS | Status: DC | PRN

## 2022-11-01 MED ORDER — BUPIVACAINE HCL (PF) 0.25 % IJ SOLN
INTRAMUSCULAR | Status: DC | PRN
  Administered 2022-11-01: 8 mL via EPIDURAL
  Administered 2022-11-01: 10 mL via EPIDURAL

## 2022-11-01 MED ORDER — NIFEDIPINE ER OSMOTIC RELEASE 30 MG PO TB24: 30.0000 mg | ORAL_TABLET | Freq: Every day | ORAL | Status: DC

## 2022-11-01 MED ORDER — ZOLPIDEM TARTRATE 5 MG PO TABS: 5.0000 mg | ORAL_TABLET | Freq: Every evening | ORAL | Status: DC | PRN

## 2022-11-01 MED ORDER — ACETAMINOPHEN 325 MG PO TABS: 650.0000 mg | ORAL_TABLET | ORAL | Status: DC | PRN

## 2022-11-01 MED ORDER — SENNOSIDES-DOCUSATE SODIUM 8.6-50 MG PO TABS
2.0000 | ORAL_TABLET | Freq: Every day | ORAL | Status: DC
  Administered 2022-11-02 – 2022-11-03 (×2): 2 via ORAL
  Filled 2022-11-01 (×2): qty 2

## 2022-11-01 MED ORDER — IBUPROFEN 600 MG PO TABS
600.0000 mg | ORAL_TABLET | Freq: Four times a day (QID) | ORAL | Status: DC
  Administered 2022-11-01 – 2022-11-03 (×9): 600 mg via ORAL
  Filled 2022-11-01 (×9): qty 1

## 2022-11-01 MED ORDER — FENTANYL CITRATE (PF) 100 MCG/2ML IJ SOLN
INTRAMUSCULAR | Status: DC | PRN
  Administered 2022-11-01: 100 ug via EPIDURAL

## 2022-11-01 MED ORDER — WITCH HAZEL-GLYCERIN EX PADS: 1.0000 | MEDICATED_PAD | CUTANEOUS | Status: DC | PRN

## 2022-11-01 MED ORDER — PANTOPRAZOLE SODIUM 40 MG PO TBEC
40.0000 mg | DELAYED_RELEASE_TABLET | Freq: Every day | ORAL | Status: DC
  Administered 2022-11-03: 40 mg via ORAL
  Filled 2022-11-01 (×2): qty 1

## 2022-11-01 MED ORDER — OXYCODONE HCL 5 MG PO TABS: 10.0000 mg | ORAL_TABLET | ORAL | Status: DC | PRN

## 2022-11-01 MED ORDER — ONDANSETRON HCL 4 MG/2ML IJ SOLN: 4.0000 mg | INTRAMUSCULAR | Status: DC | PRN

## 2022-11-01 MED ORDER — SIMETHICONE 80 MG PO CHEW: 80.0000 mg | CHEWABLE_TABLET | ORAL | Status: DC | PRN

## 2022-11-01 NOTE — Anesthesia Postprocedure Evaluation (Signed)
Anesthesia Post Note  Patient: Jamie Lawrence  Procedure(s) Performed: AN AD HOC LABOR EPIDURAL     Patient location during evaluation: Mother Baby Anesthesia Type: Epidural Level of consciousness: awake and awake and alert Pain management: pain level controlled Vital Signs Assessment: post-procedure vital signs reviewed and stable Respiratory status: spontaneous breathing Cardiovascular status: stable Postop Assessment: no headache, no backache and epidural receding Anesthetic complications: no  No notable events documented.  Last Vitals:  Vitals:   11/01/22 1430 11/01/22 1750  BP: 129/81 131/81  Pulse: 87 74  Resp: 20 20  Temp: 37 C 36.8 C  SpO2: 99% 99%    Last Pain:  Vitals:   11/01/22 1750  TempSrc: Oral  PainSc: 0-No pain   Pain Goal:                   [name]

## 2022-11-01 NOTE — Progress Notes (Signed)
B/P 132/87 on admission to Mother/Baby Unit. At one hour recheck B/P 131/88. Patient asymptomatic. Notified Dr. Su Hilt. Orders given for Blood pressure parameters of when to call.

## 2022-11-01 NOTE — Plan of Care (Signed)
  Problem: Education: Goal: Knowledge of disease or condition will improve Outcome: Completed/Met Goal: Knowledge of the prescribed therapeutic regimen will improve Outcome: Completed/Met   Problem: Fluid Volume: Goal: Peripheral tissue perfusion will improve Outcome: Completed/Met   Problem: Clinical Measurements: Goal: Complications related to disease process, condition or treatment will be avoided or minimized Outcome: Completed/Met   Problem: Education: Goal: Knowledge of Childbirth will improve Outcome: Completed/Met Goal: Ability to make informed decisions regarding treatment and plan of care will improve Outcome: Completed/Met Goal: Ability to state and carry out methods to decrease the pain will improve Outcome: Completed/Met Goal: Individualized Educational Video(s) Outcome: Completed/Met   Problem: Coping: Goal: Ability to verbalize concerns and feelings about labor and delivery will improve Outcome: Completed/Met   Problem: Life Cycle: Goal: Ability to make normal progression through stages of labor will improve Outcome: Completed/Met Goal: Ability to effectively push during vaginal delivery will improve Outcome: Completed/Met   Problem: Role Relationship: Goal: Will demonstrate positive interactions with the child Outcome: Completed/Met   Problem: Safety: Goal: Risk of complications during labor and delivery will decrease Outcome: Completed/Met   Problem: Pain Management: Goal: Relief or control of pain from uterine contractions will improve Outcome: Completed/Met   Problem: Education: Goal: Knowledge of General Education information will improve Description: Including pain rating scale, medication(s)/side effects and non-pharmacologic comfort measures Outcome: Completed/Met   Problem: Health Behavior/Discharge Planning: Goal: Ability to manage health-related needs will improve Outcome: Completed/Met   Problem: Clinical Measurements: Goal: Ability  to maintain clinical measurements within normal limits will improve Outcome: Completed/Met Goal: Will remain free from infection Outcome: Completed/Met Goal: Diagnostic test results will improve Outcome: Completed/Met Goal: Respiratory complications will improve Outcome: Completed/Met Goal: Cardiovascular complication will be avoided Outcome: Completed/Met   Problem: Activity: Goal: Risk for activity intolerance will decrease Outcome: Completed/Met   Problem: Nutrition: Goal: Adequate nutrition will be maintained Outcome: Completed/Met   Problem: Coping: Goal: Level of anxiety will decrease Outcome: Completed/Met   Problem: Elimination: Goal: Will not experience complications related to bowel motility Outcome: Completed/Met Goal: Will not experience complications related to urinary retention Outcome: Completed/Met   Problem: Pain Managment: Goal: General experience of comfort will improve Outcome: Completed/Met   Problem: Safety: Goal: Ability to remain free from injury will improve Outcome: Completed/Met   Problem: Skin Integrity: Goal: Risk for impaired skin integrity will decrease Outcome: Completed/Met

## 2022-11-01 NOTE — Progress Notes (Addendum)
KATILYNN SWEITZER is a 22 y.o. [phone] at [redacted]w[redacted]d admitted for induction of labor due to gestational hypertension.  Subjective: Called to evaluate FHT. Noted recurrent decelerations. Patient just received epidural bolus for pain control. SVE: 6/90/-1. Interventions performed, IUPC placed w/o difficulty with clear flashback noted and amnioinfusion started. FHT improved.   Objective: BP 122/87   Pulse 86   Temp 98.2 F (36.8 C) (Oral)   Resp 14   Ht 4\' 11"  (1.499 m)   Wt 70.8 kg   LMP 02/14/2022   SpO2 100%   BMI 31.51 kg/m  No intake/output data recorded. Total I/O In: -  Out: 875 [Urine:875]  FHT:  FHR: 135 bpm, variability: moderate,  accelerations:  Present,  decelerations:  Present variable, early, late UC:   regular, every 2-3 minutes SVE:   Dilation: 6 Effacement (%): 80, 90 Station: -1 Exam by:: Dr. Hester Mates  Labs: Lab Results  Component Value Date   WBC 7.8 10/31/2022   HGB 12.4 10/31/2022   HCT 35.5 (L) 10/31/2022   MCV 80.9 10/31/2022   PLT 337 10/31/2022    Assessment / Plan: Induction of labor due to gestational hypertension,  progressing well on pitocin  Labor: Progressing normally Preeclampsia:  no signs or symptoms of toxicity Fetal Wellbeing:  Category II Pain Control:  Epidural I/D:   GBS positive on PCN Anticipated MOD:  NSVD  Jackie Plum, MD 11/01/2022, 4:04 AM

## 2022-11-02 LAB — CBC
HCT: 33.5 % — ABNORMAL LOW (ref 36.0–46.0)
Hemoglobin: 11 g/dL — ABNORMAL LOW (ref 12.0–15.0)
MCH: 27.4 pg (ref 26.0–34.0)
MCHC: 32.8 g/dL (ref 30.0–36.0)
MCV: 83.3 fL (ref 80.0–100.0)
Platelets: 290 10*3/uL (ref 150–400)
RBC: 4.02 MIL/uL (ref 3.87–5.11)
RDW: 14 % (ref 11.5–15.5)
WBC: 10.7 10*3/uL — ABNORMAL HIGH (ref 4.0–10.5)
nRBC: 0 % (ref 0.0–0.2)

## 2022-11-02 NOTE — Clinical Social Work Maternal (Signed)
CLINICAL SOCIAL WORK MATERNAL/CHILD NOTE  Patient Details  Name: Jamie Lawrence MRN: [id number] Date of Birth: Apr 22, 2001  Date:  11/02/2022  Clinical Social Worker Initiating Note:  Enos Fling Date/Time: Initiated:  11/02/22/1430     Child's Name:  Jamie Lawrence   Biological Parents:  Mother, Father Jamie Lawrence and [name]   Need for Interpreter:  None   Reason for Referral:   (Anxiety/Depression,Bipolar, MDD,Bulimia,SIx2, SA, Edinburgh 11)   Address:  [address] 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 [employer])  [place]      Pediatrician Fax Number:    Risk Factors/Current Problems:  Mental Health Concerns     Cognitive State:  Able to Concentrate  , Alert  ,  Goal Oriented  , Insightful  , Linear Thinking   Mood/Affect:  Interested  , Comfortable  , Happy  , Bright     CSW Assessment: CSW received consult Anxiety, Depression,Bipolar,MDD,  Bulimia,SI x2,SA. CSW met [name] at bedside to complete full psychosocial assessment. CSW entered room, introduced herself and explained the reason for the consult. [name] was polite, easy to engage, receptive to meeting with CSW, and appeared forthcoming.  CSW acknowledged Edinburgh score of 11 and listened to [name] explore her feelings about transitioning into motherhood of 2. CSW collected [name]'s demographic information and inquired about her mental health history. [name] reported being diagnosed at the age of 7-8 with bipolar,anxiety,depression, MDD and hypomania. [name] reported the reason for the diagnosis; came about when she experienced a death in the family and she blamed herself for the passing. [name] reported experiencing symptoms of PPD after her 1st pregnancy(2018); however when she reach out for support, her provider was no help. [name] reported her bipolar symptoms as maniac and depressive; her last episode was at least 2 years ago. [name] reported coping strategies that included crying out the feelings and/or breathing. [name] reported being on several different medications; however she is currently not prescribed anything and denied participating in therapy for support. [name] reported currently feeling "good" and is bonded well with infant.  [name] reported no concerns around bulimia, SI  and SA. CSW provided education regarding the baby blues period vs. perinatal mood disorders, discussed treatment and gave resources for mental health follow up if concerns arise.  CSW recommends self-evaluation during the postpartum time period using the New Mom Checklist from Postpartum Progress and encouraged [name] to contact a medical professional if symptoms are noted at any time. CSW assessed for safety with [name] SI/HI/DV;[name] denied all.  CSW assessed for resources needs with [name]; she reported having WIC and food stamps. [name] reported having all essential items for infant including a car seat and bassinet for safe sleeping.  CSW provided  review of Sudden Infant Death Syndrome (SIDS) precautions.  CSW Plan/Description:  No Further Intervention Required/No Barriers to Discharge, Sudden Infant Death Syndrome (SIDS) Education.    Barnetta Chapel, LCSW 11/02/2022, 2:33 PM
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
## 2022-11-03 MED ORDER — IBUPROFEN 600 MG PO TABS: 600.0000 mg | ORAL_TABLET | Freq: Four times a day (QID) | ORAL | 0 refills | Status: AC

## 2022-11-03 MED ORDER — NIFEDIPINE ER 30 MG PO TB24: 30.0000 mg | ORAL_TABLET | Freq: Every day | ORAL | 0 refills | Status: AC

## 2022-11-03 MED ORDER — ACETAMINOPHEN 325 MG PO TABS: 650.0000 mg | ORAL_TABLET | ORAL | Status: AC | PRN

## 2022-11-03 NOTE — Discharge Summary (Signed)
Postpartum Discharge Summary  Date of Service updated 11/03/22    Patient Name: Jamie Lawrence DOB: Feb 01, 2001 MRN: [id number]  Date of admission: 10/31/2022 Delivery date:11/01/2022  Delivering provider: Reesa Chew D  Date of discharge: 11/03/2022  Admitting diagnosis: Gestational hypertension [O13.9] Intrauterine pregnancy: [redacted]w[redacted]d     Secondary diagnosis:  Principal Problem:   Gestational hypertension Active Problems:   Vaginal delivery  Additional problems:  Patient Active Problem List   Diagnosis Date Noted   Vaginal delivery 11/01/2022   Gestational hypertension 10/31/2022   Depression    Panic attacks 07/07/2017   MDD (major depressive disorder), single episode, severe , no psychosis 12/08/2014   GAD (generalized anxiety disorder) 12/08/2014   Bulimia nervosa 12/08/2014       Discharge diagnosis: Term Pregnancy Delivered and Gestational Hypertension                                              Post partum procedures: none Augmentation: AROM, Pitocin, and Cytotec Complications: None  Hospital course: Induction of Labor With Vaginal Delivery   22 y.o. yo [id number] at [redacted]w[redacted]d was admitted to the hospital 10/31/2022 for induction of labor.  Indication for induction: Gestational hypertension.  Patient had an uncomplicated labor course.  Membrane Rupture Time/Date: 8:31 PM ,10/31/2022   Delivery Method:Vaginal, Spontaneous  Episiotomy: None  Lacerations:  Periurethral  Details of delivery can be found in separate delivery note.  Patient had an uncomplicated postpartum course complicated. There is a hx of depression, anxiety, and panic attacks. A social work consult was completed postpartum and there are no barriers to discharge. Pre-pregnancy SSRI has been re-ordered in the postpartum period and pt will follow-up for mood evaluation. Affect and bonding behaviors have been appropriate during this hospitalization. Patient is discharged home 11/03/22.  Newborn Data: Birth  date:11/01/2022  Birth time:5:52 AM  Gender:Female  Living status:Living  Apgars:6 ,9  Weight:2190 g   Magnesium Sulfate received: No BMZ received: No Rhophylac:N/A MMR:N/A Transfusion:No  Physical exam  Vitals:   11/02/22 0946 11/02/22 1413 11/02/22 2050 11/03/22 0519  BP: 122/86 128/89 125/88 123/78  Pulse: 87 84 86 70  Resp:  16 17 16   Temp:  98 F (36.7 C) 98.2 F (36.8 C) 98.5 F (36.9 C)  TempSrc:  Oral Axillary Oral  SpO2:  100% 100% 100%  Weight:      Height:       General: alert, cooperative, and no distress Lochia: appropriate Uterine Fundus: firm Incision: N/A DVT Evaluation: No evidence of DVT seen on physical exam. No cords or calf tenderness. No significant calf/ankle edema. Labs: Lab Results  Component Value Date   WBC 10.7 (H) 11/02/2022   HGB 11.0 (L) 11/02/2022   HCT 33.5 (L) 11/02/2022   MCV 83.3 11/02/2022   PLT 290 11/02/2022      Latest Ref Rng & Units 10/31/2022    6:17 AM  CMP  Glucose 70 - 99 mg/dL 77   BUN 6 - 20 mg/dL 6   Creatinine 6.00 - 4.59 mg/dL 9.77   Sodium 414 - 239 mmol/L 135   Potassium 3.5 - 5.1 mmol/L 3.9   Chloride 98 - 111 mmol/L 106   CO2 22 - 32 mmol/L 21   Calcium 8.9 - 10.3 mg/dL 8.6   Total Protein 6.5 - 8.1 g/dL 6.0   Total Bilirubin 0.3 -  1.2 mg/dL 0.7   Alkaline Phos 38 - 126 U/L 222   AST 15 - 41 U/L 18   ALT 0 - 44 U/L 10    Edinburgh Score:    11/01/2022    2:30 PM  Edinburgh Postnatal Depression Scale Screening Tool  I have been able to laugh and see the funny side of things. 0  I have looked forward with enjoyment to things. 0  I have blamed myself unnecessarily when things went wrong. 2  I have been anxious or worried for no good reason. 3  I have felt scared or panicky for no good reason. 2  Things have been getting on top of me. 1  I have been so unhappy that I have had difficulty sleeping. 1  I have felt sad or miserable. 1  I have been so unhappy that I have been crying. 1  The thought  of harming myself has occurred to me. 0  Edinburgh Postnatal Depression Scale Total 11      After visit meds:  Allergies as of 11/03/2022   No Known Allergies      Medication List     TAKE these medications    acetaminophen 325 MG tablet Commonly known as: Tylenol Take 2 tablets (650 mg total) by mouth every 4 (four) hours as needed (for pain scale < 4).   escitalopram 10 MG tablet Commonly known as: LEXAPRO Take by mouth.   hydrOXYzine 25 MG tablet Commonly known as: ATARAX Take 25 mg by mouth every 6 (six) hours.   ibuprofen 600 MG tablet Commonly known as: ADVIL Take 1 tablet (600 mg total) by mouth every 6 (six) hours.   NIFEdipine 30 MG 24 hr tablet Commonly known as: ADALAT CC Take 1 tablet (30 mg total) by mouth daily.   ondansetron 4 MG tablet Commonly known as: ZOFRAN Take 4 mg by mouth 3 (three) times daily as needed.   pantoprazole 40 MG tablet Commonly known as: PROTONIX Take 40 mg by mouth daily.         Discharge home in stable condition Infant Feeding: Bottle Infant Disposition:home with mother Discharge instruction: per After Visit Summary and Postpartum booklet. Activity: Advance as tolerated. Pelvic rest for 6 weeks.  Diet: low salt diet Anticipated Birth Control:  declines contraception, handout provided Postpartum Appointment:6 weeks Additional Postpartum F/U: Postpartum Depression checkup and BP check 1 week Future Appointments:No future appointments. Follow up Visit:  Follow-up Information     [facility]. Go in 1 week(s).   Specialty: Obstetrics and Gynecology Why: Return to [facility] in 1 week for BP check and then in 6 weeks for regular postpartum visit. Contact information: [address] [place] [address] [phone]                    11/03/2022 Roma Schanz, CNM

## 2022-11-08 DIAGNOSIS — O165 Unspecified maternal hypertension, complicating the puerperium: Secondary | ICD-10-CM | POA: Diagnosis not present

## 2022-11-13 ENCOUNTER — Telehealth (HOSPITAL_COMMUNITY): Payer: Self-pay | Admitting: *Deleted

## 2022-11-13 NOTE — Telephone Encounter (Signed)
Mom reports feeling good. No concerns about herself at this time. EPDS declined per mom but reports home nurse completed with her last week. Reports she scored 15 annd nurse was going to notify her OB and primary care providers. [facility] score=11) Mom reports baby is doing well. Feeding, peeing, and pooping without difficulty. Safe sleep reviewed. Mom reports no concerns about baby at present.  Duffy Rhody, RN 11-13-2022 at 1:57pm

## 2022-11-22 DIAGNOSIS — Z419 Encounter for procedure for purposes other than remedying health state, unspecified: Secondary | ICD-10-CM | POA: Diagnosis not present

## 2022-12-13 DIAGNOSIS — E559 Vitamin D deficiency, unspecified: Secondary | ICD-10-CM | POA: Diagnosis not present

## 2022-12-23 DIAGNOSIS — Z419 Encounter for procedure for purposes other than remedying health state, unspecified: Secondary | ICD-10-CM | POA: Diagnosis not present

## 2023-01-22 DIAGNOSIS — Z419 Encounter for procedure for purposes other than remedying health state, unspecified: Secondary | ICD-10-CM | POA: Diagnosis not present

## 2023-02-22 DIAGNOSIS — Z419 Encounter for procedure for purposes other than remedying health state, unspecified: Secondary | ICD-10-CM | POA: Diagnosis not present

## 2023-03-25 DIAGNOSIS — Z419 Encounter for procedure for purposes other than remedying health state, unspecified: Secondary | ICD-10-CM | POA: Diagnosis not present

## 2023-04-24 DIAGNOSIS — Z419 Encounter for procedure for purposes other than remedying health state, unspecified: Secondary | ICD-10-CM | POA: Diagnosis not present

## 2023-04-30 DIAGNOSIS — M940 Chondrocostal junction syndrome [Tietze]: Secondary | ICD-10-CM | POA: Diagnosis not present

## 2023-05-25 DIAGNOSIS — Z419 Encounter for procedure for purposes other than remedying health state, unspecified: Secondary | ICD-10-CM | POA: Diagnosis not present

## 2023-05-29 DIAGNOSIS — F418 Other specified anxiety disorders: Secondary | ICD-10-CM | POA: Diagnosis not present

## 2023-05-29 DIAGNOSIS — Z113 Encounter for screening for infections with a predominantly sexual mode of transmission: Secondary | ICD-10-CM | POA: Diagnosis not present

## 2023-05-29 DIAGNOSIS — F319 Bipolar disorder, unspecified: Secondary | ICD-10-CM | POA: Diagnosis not present

## 2023-05-29 DIAGNOSIS — F32A Depression, unspecified: Secondary | ICD-10-CM | POA: Diagnosis not present

## 2023-05-29 DIAGNOSIS — Z8759 Personal history of other complications of pregnancy, childbirth and the puerperium: Secondary | ICD-10-CM | POA: Diagnosis not present

## 2023-05-29 DIAGNOSIS — G4489 Other headache syndrome: Secondary | ICD-10-CM | POA: Diagnosis not present

## 2023-05-29 DIAGNOSIS — F419 Anxiety disorder, unspecified: Secondary | ICD-10-CM | POA: Diagnosis not present

## 2023-05-29 DIAGNOSIS — Z0000 Encounter for general adult medical examination without abnormal findings: Secondary | ICD-10-CM | POA: Diagnosis not present

## 2023-05-29 DIAGNOSIS — Z01419 Encounter for gynecological examination (general) (routine) without abnormal findings: Secondary | ICD-10-CM | POA: Diagnosis not present

## 2023-06-24 DIAGNOSIS — Z419 Encounter for procedure for purposes other than remedying health state, unspecified: Secondary | ICD-10-CM | POA: Diagnosis not present

## 2023-07-14 DIAGNOSIS — J029 Acute pharyngitis, unspecified: Secondary | ICD-10-CM | POA: Diagnosis not present

## 2023-07-14 DIAGNOSIS — H6693 Otitis media, unspecified, bilateral: Secondary | ICD-10-CM | POA: Diagnosis not present

## 2023-07-25 DIAGNOSIS — Z419 Encounter for procedure for purposes other than remedying health state, unspecified: Secondary | ICD-10-CM | POA: Diagnosis not present

## 2023-07-30 DIAGNOSIS — M542 Cervicalgia: Secondary | ICD-10-CM | POA: Diagnosis not present

## 2023-08-05 DIAGNOSIS — R6884 Jaw pain: Secondary | ICD-10-CM | POA: Diagnosis not present

## 2023-08-05 DIAGNOSIS — R591 Generalized enlarged lymph nodes: Secondary | ICD-10-CM | POA: Diagnosis not present

## 2023-08-25 DIAGNOSIS — Z419 Encounter for procedure for purposes other than remedying health state, unspecified: Secondary | ICD-10-CM | POA: Diagnosis not present

## 2023-09-22 DIAGNOSIS — Z419 Encounter for procedure for purposes other than remedying health state, unspecified: Secondary | ICD-10-CM | POA: Diagnosis not present

## 2023-11-03 DIAGNOSIS — Z419 Encounter for procedure for purposes other than remedying health state, unspecified: Secondary | ICD-10-CM | POA: Diagnosis not present

## 2023-12-03 DIAGNOSIS — Z419 Encounter for procedure for purposes other than remedying health state, unspecified: Secondary | ICD-10-CM | POA: Diagnosis not present

## 2024-01-03 DIAGNOSIS — Z419 Encounter for procedure for purposes other than remedying health state, unspecified: Secondary | ICD-10-CM | POA: Diagnosis not present

## 2024-01-20 DIAGNOSIS — B9689 Other specified bacterial agents as the cause of diseases classified elsewhere: Secondary | ICD-10-CM | POA: Diagnosis not present

## 2024-01-20 DIAGNOSIS — J028 Acute pharyngitis due to other specified organisms: Secondary | ICD-10-CM | POA: Diagnosis not present

## 2024-02-02 DIAGNOSIS — Z419 Encounter for procedure for purposes other than remedying health state, unspecified: Secondary | ICD-10-CM | POA: Diagnosis not present

## 2024-02-13 DIAGNOSIS — G43009 Migraine without aura, not intractable, without status migrainosus: Secondary | ICD-10-CM | POA: Diagnosis not present

## 2024-03-04 DIAGNOSIS — Z419 Encounter for procedure for purposes other than remedying health state, unspecified: Secondary | ICD-10-CM | POA: Diagnosis not present

## 2024-04-04 DIAGNOSIS — Z419 Encounter for procedure for purposes other than remedying health state, unspecified: Secondary | ICD-10-CM | POA: Diagnosis not present

## 2024-05-04 DIAGNOSIS — Z419 Encounter for procedure for purposes other than remedying health state, unspecified: Secondary | ICD-10-CM | POA: Diagnosis not present
